# Patient Record
Sex: Male | Born: 1950 | Race: White | Hispanic: No | Marital: Married | Smoking: Former smoker
Health system: Southern US, Community
[De-identification: ages and names within clinical notes are randomized; demographics above are authoritative.]

## PROBLEM LIST (undated history)

## (undated) DIAGNOSIS — C61 Malignant neoplasm of prostate: Secondary | ICD-10-CM

## (undated) DIAGNOSIS — N529 Male erectile dysfunction, unspecified: Secondary | ICD-10-CM

## (undated) DIAGNOSIS — E7800 Pure hypercholesterolemia, unspecified: Secondary | ICD-10-CM

## (undated) DIAGNOSIS — I10 Essential (primary) hypertension: Secondary | ICD-10-CM

## (undated) DIAGNOSIS — R351 Nocturia: Secondary | ICD-10-CM

## (undated) HISTORY — PX: PROSTATE BIOPSY: SHX241

---

## 1971-02-13 HISTORY — PX: INGUINAL HERNIA REPAIR: SUR1180

## 2003-11-11 ENCOUNTER — Ambulatory Visit (HOSPITAL_COMMUNITY): Admission: RE | Admit: 2003-11-11 | Discharge: 2003-11-11 | Payer: Self-pay | Admitting: General Surgery

## 2006-02-12 HISTORY — PX: UMBILICAL HERNIA REPAIR: SHX196

## 2014-05-05 ENCOUNTER — Encounter (HOSPITAL_COMMUNITY): Payer: Self-pay

## 2014-05-05 ENCOUNTER — Emergency Department (HOSPITAL_COMMUNITY)
Admission: EM | Admit: 2014-05-05 | Discharge: 2014-05-06 | Discharge status: Against Medical Advice | Payer: Self-pay | Attending: Emergency Medicine | Admitting: Emergency Medicine

## 2014-05-05 DIAGNOSIS — R040 Epistaxis: Secondary | ICD-10-CM | POA: Insufficient documentation

## 2014-05-05 NOTE — ED Notes (Signed)
Reports nosebleed started after he blew his nose three hours PTA.  Paramedics called to his residence report BP was 257 systolic.

## 2017-02-22 DIAGNOSIS — Z125 Encounter for screening for malignant neoplasm of prostate: Secondary | ICD-10-CM | POA: Diagnosis not present

## 2017-02-22 DIAGNOSIS — E7800 Pure hypercholesterolemia, unspecified: Secondary | ICD-10-CM | POA: Diagnosis not present

## 2017-02-22 DIAGNOSIS — Z6828 Body mass index (BMI) 28.0-28.9, adult: Secondary | ICD-10-CM | POA: Diagnosis not present

## 2017-02-22 DIAGNOSIS — Z0000 Encounter for general adult medical examination without abnormal findings: Secondary | ICD-10-CM | POA: Diagnosis not present

## 2017-02-22 DIAGNOSIS — I10 Essential (primary) hypertension: Secondary | ICD-10-CM | POA: Diagnosis not present

## 2017-02-22 DIAGNOSIS — Z23 Encounter for immunization: Secondary | ICD-10-CM | POA: Diagnosis not present

## 2017-02-22 DIAGNOSIS — R972 Elevated prostate specific antigen [PSA]: Secondary | ICD-10-CM | POA: Diagnosis not present

## 2017-02-22 DIAGNOSIS — Z1389 Encounter for screening for other disorder: Secondary | ICD-10-CM | POA: Diagnosis not present

## 2017-03-06 DIAGNOSIS — R972 Elevated prostate specific antigen [PSA]: Secondary | ICD-10-CM | POA: Diagnosis not present

## 2017-03-13 DIAGNOSIS — I10 Essential (primary) hypertension: Secondary | ICD-10-CM | POA: Diagnosis not present

## 2017-03-13 DIAGNOSIS — F1010 Alcohol abuse, uncomplicated: Secondary | ICD-10-CM | POA: Diagnosis not present

## 2017-03-13 DIAGNOSIS — E7800 Pure hypercholesterolemia, unspecified: Secondary | ICD-10-CM | POA: Diagnosis not present

## 2017-03-13 DIAGNOSIS — Z6828 Body mass index (BMI) 28.0-28.9, adult: Secondary | ICD-10-CM | POA: Diagnosis not present

## 2017-03-13 DIAGNOSIS — R972 Elevated prostate specific antigen [PSA]: Secondary | ICD-10-CM | POA: Diagnosis not present

## 2017-04-08 DIAGNOSIS — C61 Malignant neoplasm of prostate: Secondary | ICD-10-CM | POA: Diagnosis not present

## 2017-04-08 DIAGNOSIS — R972 Elevated prostate specific antigen [PSA]: Secondary | ICD-10-CM | POA: Diagnosis not present

## 2017-04-15 DIAGNOSIS — C61 Malignant neoplasm of prostate: Secondary | ICD-10-CM | POA: Diagnosis not present

## 2017-04-16 ENCOUNTER — Encounter: Payer: Self-pay | Admitting: Radiation Oncology

## 2017-04-30 DIAGNOSIS — C61 Malignant neoplasm of prostate: Secondary | ICD-10-CM | POA: Diagnosis not present

## 2017-05-21 ENCOUNTER — Encounter: Payer: Self-pay | Admitting: Radiation Oncology

## 2017-05-21 NOTE — Progress Notes (Signed)
GU Location of Tumor / Histology: prostatic adenocarcinoma  If Prostate Cancer, Gleason Score is (3 + 4) and PSA is (17.1). Prostate volume: 33 cc.  Dolores Hoose had an elevated PSA of 15 noted early January 2019 by his PCP, Dr. Domenick Gong. Patient reports this was the first time he was ever told his PSA was elevated. Reports this was his first physical because he secured Medicare this year.  Patient referred by Dr. Osborne Casco to Dr. Tresa Moore for further evaluation of an elevated PSA.  Biopsies of prostate (if applicable) revealed:    Past/Anticipated interventions by urology, if any: prostate biopsy, referral for consideration of seed implant  Past/Anticipated interventions by medical oncology, if any: no  Weight changes, if any: no  Bowel/Bladder complaints, if any: Reports nocturia x 3, reports urine stream alternates between strong and weak only at night. IPSS 6. Denies dysuria, hematuria, leakage or incontinence.   Nausea/Vomiting, if any: no  Pain issues, if any:  Reports intermittent muscle soreness in left arm and left leg that resolve without intervention  SAFETY ISSUES:  Prior radiation? no  Pacemaker/ICD? no  Possible current pregnancy? no  Is the patient on methotrexate? no  Current Complaints / other details:  67 year old male. Married. Resides in [place]

## 2017-05-22 ENCOUNTER — Other Ambulatory Visit: Payer: Self-pay

## 2017-05-22 ENCOUNTER — Ambulatory Visit
Admission: RE | Admit: 2017-05-22 | Discharge: 2017-05-22 | Disposition: A | Discharge status: Home / Self Care | Payer: PPO | Source: Ambulatory Visit | Attending: Radiation Oncology | Admitting: Radiation Oncology

## 2017-05-22 ENCOUNTER — Encounter: Payer: Self-pay | Admitting: Radiation Oncology

## 2017-05-22 DIAGNOSIS — C61 Malignant neoplasm of prostate: Secondary | ICD-10-CM

## 2017-05-22 DIAGNOSIS — Z87891 Personal history of nicotine dependence: Secondary | ICD-10-CM | POA: Insufficient documentation

## 2017-05-22 DIAGNOSIS — Z79899 Other long term (current) drug therapy: Secondary | ICD-10-CM | POA: Insufficient documentation

## 2017-05-22 DIAGNOSIS — R972 Elevated prostate specific antigen [PSA]: Secondary | ICD-10-CM | POA: Diagnosis not present

## 2017-05-22 DIAGNOSIS — I10 Essential (primary) hypertension: Secondary | ICD-10-CM | POA: Diagnosis not present

## 2017-05-22 DIAGNOSIS — E7800 Pure hypercholesterolemia, unspecified: Secondary | ICD-10-CM | POA: Insufficient documentation

## 2017-05-22 HISTORY — DX: Essential (primary) hypertension: I10

## 2017-05-22 HISTORY — DX: Pure hypercholesterolemia, unspecified: E78.00

## 2017-05-22 HISTORY — DX: Malignant neoplasm of prostate: C61

## 2017-05-22 NOTE — Progress Notes (Signed)
See progress note under physician encounter. 

## 2017-05-22 NOTE — Progress Notes (Signed)
Radiation Oncology         [phone] ________________________________  Initial Outpatient Consultation  Name: Willie Jackson MRN: [id number]  Date: 05/22/2017  DOB: 1950-11-22  CC:No primary care provider on file.  Alexis Frock, MD   REFERRING PHYSICIAN: Alexis Frock, MD  DIAGNOSIS: 67 y.o. gentleman with Stage T1c adenocarcinoma of the prostate with Gleason Score of 3+4, and PSA of 17.1    ICD-10-CM   1. Malignant neoplasm of prostate ([place]) C61     HISTORY OF PRESENT ILLNESS: Willie Jackson is a 67 y.o. male with a diagnosis of prostate cancer. He was noted to have an elevated PSA of 15 by his primary care physician, Dr. Osborne Casco.  He reports this was his first physical and the first time he was ever told his PSA was elevated.  This was repeated a few weeks later on 02/25/17 and was further elevated at 17.1.  Accordingly, he was referred for evaluation in urology to Dr. Tresa Moore on 03/06/17, where a digital rectal examination was performed at that time revealing induration on the right but no discrete nodularity.  The patient proceeded to transrectal ultrasound with 12 biopsies of the prostate on 04/08/17.  The prostate volume measured 33 cc.  Out of 12 core biopsies, 3 were positive.  The maximum Gleason score was 3+4, and this was seen in the right mid lateral and right apex lateral.  Additionally there was Gleason 3+3 disease in the left mid lateral.    The patient reviewed the biopsy results with his urologist and he has kindly been referred today for discussion of potential radiation treatment options.   PREVIOUS RADIATION THERAPY: No  PAST MEDICAL HISTORY:  Past Medical History:  Diagnosis Date  . Hypercholesterolemia   . Hypertension   . Prostate cancer ([place])       PAST SURGICAL HISTORY: Past Surgical History:  Procedure Laterality Date  . HERNIA REPAIR    . HERNIA REPAIR     at the age of 51-20  . INGUINAL HERNIA REPAIR    . prostate needle biopsy      FAMILY  HISTORY:  Family History  Problem Relation Age of Onset  . Lung cancer Mother   . Throat cancer Father     SOCIAL HISTORY:  Social History   Socioeconomic History  . Marital status: Legally Separated    Spouse name: Not on file  . Number of children: Not on file  . Years of education: Not on file  . Highest education level: Not on file  Occupational History  . Occupation: Dealer  Social Needs  . Financial resource strain: Not on file  . Food insecurity:    Worry: Not on file    Inability: Not on file  . Transportation needs:    Medical: Not on file    Non-medical: Not on file  Tobacco Use  . Smoking status: Former Smoker    Packs/day: 1.00    Years: 41.00    Pack years: 41.00    Types: Cigarettes    Last attempt to quit: 02/12/1994    Years since quitting: 23.2  . Smokeless tobacco: Never Used  Substance and Sexual Activity  . Alcohol use: Yes    Alcohol/week: 7.2 oz    Types: 12 Cans of beer per week  . Drug use: No  . Sexual activity: Yes  Lifestyle  . Physical activity:    Days per week: Not on file    Minutes per session: Not on file  .  Stress: Not on file  Relationships  . Social connections:    Talks on phone: Not on file    Gets together: Not on file    Attends religious service: Not on file    Active member of club or organization: Not on file    Attends meetings of clubs or organizations: Not on file    Relationship status: Not on file  . Intimate partner violence:    Fear of current or ex partner: Not on file    Emotionally abused: Not on file    Physically abused: Not on file    Forced sexual activity: Not on file  Other Topics Concern  . Not on file  Social History Narrative   Patient and wife reside in [place]. Three children total. Wife has 1 by first marriage. Patient has two by first marriage. 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  MEDICATIONS:  Current Outpatient Medications  Medication Sig Dispense Refill  . atorvastatin (LIPITOR) 20 MG tablet Take 20 mg by mouth daily.    Marland Kitchen olmesartan (BENICAR) 20 MG tablet Take 20 mg by mouth daily.     No current facility-administered medications for this encounter.     REVIEW OF SYSTEMS:  On review of systems, the patient reports that he is doing well overall. He denies any chest pain, shortness of breath, cough, fevers, chills, night sweats, or unintended weight changes. He denies any bowel disturbances, and denies abdominal pain, nausea or vomiting. He reports intermittent muscle soreness in his left arm and left leg that resolves without any intervention. His IPSS was 6, indicating mild urinary symptoms with nocturia x3. He reports his urine stream alternates between strong and weak at night. He denies any dysuria, hematuria, leakage or incontinence. He is able to complete sexual activity with most attempts. A complete review of systems is obtained and is otherwise negative.  PHYSICAL EXAM:  Wt Readings from Last 3 Encounters:  05/22/17 178 lb 3.2 oz (80.8 kg)  05/05/14 175 lb (79.4 kg)   Temp Readings from Last 3 Encounters:  05/22/17 97.9 F (36.6 C) (Oral)  05/05/14 98.2 F (36.8 C) (Oral)   BP Readings from Last 3 Encounters:  05/22/17 (!) 147/73  05/05/14 186/97   Pulse Readings from Last 3 Encounters:  05/22/17 84  05/05/14 109   Pain Assessment Pain Score: 0-No pain/10  In general this is a well appearing caucasian male in no acute distress. He is alert and oriented x4 and appropriate throughout the examination. HEENT reveals that the patient is normocephalic, atraumatic. EOMs are intact. PERRLA. Skin is intact without any evidence of gross lesions. Cardiovascular exam reveals a regular rate and rhythm, no clicks rubs or murmurs are auscultated. Chest is clear to auscultation bilaterally. Lymphatic assessment is performed and does not reveal any  adenopathy in the cervical, supraclavicular, axillary, or inguinal chains. Abdomen has active bowel sounds in all quadrants and is intact. The abdomen is soft, non tender, non distended. Lower extremities are negative for pretibial pitting edema, deep calf tenderness, cyanosis or clubbing.   KPS = 100  100 - Normal; no complaints; no evidence of disease. 90   - Able to carry on normal activity; minor signs or symptoms of disease. 80   - Normal activity with effort; some signs or symptoms of disease. 86   - Cares for self; unable to carry on  normal activity or to do active work. 60   - Requires occasional assistance, but is able to care for most of his personal needs. 50   - Requires considerable assistance and frequent medical care. 78   - Disabled; requires special care and assistance. 90   - Severely disabled; hospital admission is indicated although death not imminent. 73   - Very sick; hospital admission necessary; active supportive treatment necessary. 10   - Moribund; fatal processes progressing rapidly. 0     - Dead  Karnofsky DA, Abelmann WH, Craver LS and Burchenal JH [phone]) The use of the nitrogen mustards in the palliative treatment of carcinoma: with particular reference to bronchogenic carcinoma Cancer 1 [phone]  LABORATORY DATA:  No results found for: WBC, HGB, HCT, MCV, PLT No results found for: NA, K, CL, CO2 No results found for: ALT, AST, GGT, ALKPHOS, BILITOT   RADIOGRAPHY: No results found.    IMPRESSION/PLAN: 1. 67 y.o. gentleman with Stage T1c adenocarcinoma of the prostate with Gleason Score of 3+4, and PSA of 17.1. we discussed the patient's workup and outlined the nature of prostate cancer in this setting. The patient's T stage, Gleason's score, and PSA put him into the favorable intermediate risk group. Accordingly, he is eligible for a variety of potential treatment options including prostatectomy, brachytherapy, 5.5 - 8 weeks of external radiation or 5 weeks of  external radiation combined with a brachytherapy boost. We discussed the available radiation techniques, and focused on the details and logistics and delivery. We discussed and outlined the risks, benefits, short and long-term effects associated with radiotherapy and compared and contrasted these with prostatectomy. We discussed the role of SpaceOAR in reducing the rectal toxicity associated with radiotherapy.   At the conclusion of our conversation, the patient elects to proceed with brachytherapy and use of SpaceOAR to reduce rectal toxicity from radiotherapy.  We will share our discussion with Dr. Tresa Moore and move forward with scheduling this procedure.  The patient met briefly with Romie Jumper in our office who will be working closely with him to coordinate OR scheduling and pre and post procedure appointments.  We will contact the pharmaceutical rep to ensure that [place] is available at the time of procedure.  He will have a prostate MRI following his post-seed CT SIM to confirm appropriate distribution of the [place].  We spent more than 50% of today's time face to face with the patient in counseling and/or coordination of care.   Nicholos Johns, PA-C    Tyler Pita, MD  [facility] Direct Dial: [phone]  Fax: [phone] [email or web address]  Skype  LinkedIn    Page Me   This document serves as a record of services personally performed by Tyler Pita, MD and Freeman Caldron, PA-C. It was created on their behalf by Rae Lips, a trained medical scribe. The creation of this record is based on the scribe's personal observations and the providers' statements to them. This document has been checked and approved by the attending providers.

## 2017-05-23 ENCOUNTER — Encounter: Payer: Self-pay | Admitting: Medical Oncology

## 2017-05-29 ENCOUNTER — Telehealth: Payer: Self-pay | Admitting: *Deleted

## 2017-05-29 NOTE — Telephone Encounter (Signed)
XXXXX

## 2017-05-29 NOTE — Telephone Encounter (Signed)
CALLED PATIENT TO INFORM OF PRE-SEED APPT., SPOKE WITH PATIENT AND HE IS AWARE OF THESE APPTS. ON 06-28-17

## 2017-06-12 DIAGNOSIS — I10 Essential (primary) hypertension: Secondary | ICD-10-CM | POA: Diagnosis not present

## 2017-06-12 DIAGNOSIS — R972 Elevated prostate specific antigen [PSA]: Secondary | ICD-10-CM | POA: Diagnosis not present

## 2017-06-12 DIAGNOSIS — Z6828 Body mass index (BMI) 28.0-28.9, adult: Secondary | ICD-10-CM | POA: Diagnosis not present

## 2017-06-12 DIAGNOSIS — C61 Malignant neoplasm of prostate: Secondary | ICD-10-CM | POA: Diagnosis not present

## 2017-06-12 DIAGNOSIS — E7800 Pure hypercholesterolemia, unspecified: Secondary | ICD-10-CM | POA: Diagnosis not present

## 2017-06-12 DIAGNOSIS — F1010 Alcohol abuse, uncomplicated: Secondary | ICD-10-CM | POA: Diagnosis not present

## 2017-06-17 ENCOUNTER — Other Ambulatory Visit: Payer: Self-pay | Admitting: Urology

## 2017-06-18 ENCOUNTER — Telehealth: Payer: Self-pay | Admitting: *Deleted

## 2017-06-18 NOTE — Telephone Encounter (Signed)
Called patient to inform of implant date, lvm for a return call 

## 2017-06-25 ENCOUNTER — Telehealth: Payer: Self-pay | Admitting: *Deleted

## 2017-06-25 NOTE — Telephone Encounter (Signed)
Returned patient's phone call, lvm for a return call 

## 2017-06-27 ENCOUNTER — Telehealth: Payer: Self-pay | Admitting: *Deleted

## 2017-06-27 NOTE — Telephone Encounter (Signed)
CALLED PATIENT TO REMIND OF PRE-SEED APPTS. FOR 06-28-17, LVM FOR A RETURN CALL 

## 2017-06-28 ENCOUNTER — Encounter (HOSPITAL_COMMUNITY)
Admission: RE | Admit: 2017-06-28 | Discharge: 2017-06-28 | Disposition: A | Discharge status: Home / Self Care | Payer: PPO | Source: Ambulatory Visit

## 2017-06-28 ENCOUNTER — Ambulatory Visit (HOSPITAL_COMMUNITY)
Admission: RE | Admit: 2017-06-28 | Discharge: 2017-06-28 | Disposition: A | Discharge status: Home / Self Care | Payer: PPO | Source: Ambulatory Visit | Attending: Urology | Admitting: Urology

## 2017-06-28 ENCOUNTER — Ambulatory Visit: Admission: RE | Admit: 2017-06-28 | Payer: PPO | Source: Ambulatory Visit | Admitting: Radiation Oncology

## 2017-06-28 ENCOUNTER — Ambulatory Visit
Admission: RE | Admit: 2017-06-28 | Discharge: 2017-06-28 | Disposition: A | Discharge status: Home / Self Care | Payer: PPO | Source: Ambulatory Visit | Attending: Radiation Oncology | Admitting: Radiation Oncology

## 2017-06-28 DIAGNOSIS — Z01818 Encounter for other preprocedural examination: Secondary | ICD-10-CM | POA: Diagnosis not present

## 2017-06-28 DIAGNOSIS — Z510 Encounter for antineoplastic radiation therapy: Secondary | ICD-10-CM | POA: Diagnosis not present

## 2017-06-28 DIAGNOSIS — C61 Malignant neoplasm of prostate: Secondary | ICD-10-CM | POA: Insufficient documentation

## 2017-06-28 NOTE — Progress Notes (Signed)
  Radiation Oncology         [phone]) [phone] ________________________________  Name: Willie Jackson MRN: [id number]  Date: 06/28/2017  DOB: [birth year]  SIMULATION AND TREATMENT PLANNING NOTE PUBIC ARCH STUDY  TW:SFKCLE, Pcp Not In  Alexis Frock, MD  DIAGNOSIS: 67 y.o. male with Stage T1c adenocarcinoma of the prostate with Gleason Score of 3+4, and PSA of 17.1    ICD-10-CM   1. Malignant neoplasm of prostate ([place]) C61     COMPLEX SIMULATION:  The patient presented today for evaluation for possible prostate seed implant. He was brought to the radiation planning suite and placed supine on the CT couch. A 3-dimensional image study set was obtained in upload to the planning computer. There, on each axial slice, I contoured the prostate gland. Then, using three-dimensional radiation planning tools I reconstructed the prostate in view of the structures from the transperineal needle pathway to assess for possible pubic arch interference. In doing so, I did not appreciate any pubic arch interference. Also, the patient's prostate volume was estimated based on the drawn structure. The volume was 29 cc.  Given the pubic arch appearance and prostate volume, patient remains a good candidate to proceed with prostate seed implant. Today, he freely provided informed written consent to proceed.    PLAN: The patient will undergo prostate seed implant.   ________________________________  Sheral Apley. Tammi Klippel, M.D.  This document serves as a record of services personally performed by Tyler Pita, MD. It was created on his behalf by Rae Lips, a trained medical scribe. The creation of this record is based on the scribe's personal observations and the provider's statements to them. This document has been checked and approved by the attending provider.

## 2017-07-15 DIAGNOSIS — C61 Malignant neoplasm of prostate: Secondary | ICD-10-CM | POA: Diagnosis not present

## 2017-07-15 DIAGNOSIS — N5201 Erectile dysfunction due to arterial insufficiency: Secondary | ICD-10-CM | POA: Diagnosis not present

## 2017-08-01 ENCOUNTER — Other Ambulatory Visit: Payer: Self-pay | Admitting: Urology

## 2017-08-01 DIAGNOSIS — C61 Malignant neoplasm of prostate: Secondary | ICD-10-CM

## 2017-08-06 ENCOUNTER — Telehealth: Payer: Self-pay | Admitting: *Deleted

## 2017-08-06 NOTE — Telephone Encounter (Signed)
Called patient to remind of labs for 08-07-17, spoke with patient and he is aware of this appt.

## 2017-08-07 ENCOUNTER — Encounter (HOSPITAL_COMMUNITY)
Admission: RE | Admit: 2017-08-07 | Discharge: 2017-08-07 | Disposition: A | Discharge status: Home / Self Care | Payer: PPO | Source: Ambulatory Visit | Attending: Urology | Admitting: Urology

## 2017-08-07 DIAGNOSIS — Z01812 Encounter for preprocedural laboratory examination: Secondary | ICD-10-CM | POA: Diagnosis not present

## 2017-08-07 LAB — COMPREHENSIVE METABOLIC PANEL
ALBUMIN: 4.3 g/dL (ref 3.5–5.0)
ALK PHOS: 54 U/L (ref 38–126)
ALT: 28 U/L (ref 0–44)
ANION GAP: 7 (ref 5–15)
AST: 23 U/L (ref 15–41)
BUN: 20 mg/dL (ref 8–23)
CALCIUM: 9.4 mg/dL (ref 8.9–10.3)
CHLORIDE: 105 mmol/L (ref 98–111)
CO2: 27 mmol/L (ref 22–32)
Creatinine, Ser: 1.13 mg/dL (ref 0.61–1.24)
GFR calc Af Amer: >60 mL/min (ref >60)
GFR calc non Af Amer: >60 mL/min (ref >60)
GLUCOSE: 121 mg/dL — AB (ref 70–99)
Potassium: 4.4 mmol/L (ref 3.5–5.1)
SODIUM: 139 mmol/L (ref 135–145)
Total Bilirubin: 1 mg/dL (ref 0.3–1.2)
Total Protein: 7.7 g/dL (ref 6.5–8.1)

## 2017-08-07 LAB — CBC
HCT: 41.1 % (ref 39.0–52.0)
HEMOGLOBIN: 14.3 g/dL (ref 13.0–17.0)
MCH: 31.9 pg (ref 26.0–34.0)
MCHC: 34.8 g/dL (ref 30.0–36.0)
MCV: 91.7 fL (ref 78.0–100.0)
PLATELETS: 220 10*3/uL (ref 150–400)
RBC: 4.48 MIL/uL (ref 4.22–5.81)
RDW: 12.4 % (ref 11.5–15.5)
WBC: 7.7 10*3/uL (ref 4.0–10.5)

## 2017-08-07 LAB — APTT: APTT: 29 s (ref 24–36)

## 2017-08-07 LAB — PROTIME-INR
INR: 1.14
Prothrombin Time: 14.5 seconds (ref 11.4–15.2)

## 2017-08-08 ENCOUNTER — Other Ambulatory Visit: Payer: Self-pay

## 2017-08-08 ENCOUNTER — Encounter (HOSPITAL_BASED_OUTPATIENT_CLINIC_OR_DEPARTMENT_OTHER): Payer: Self-pay | Admitting: *Deleted

## 2017-08-08 NOTE — Progress Notes (Signed)
Spoke w/ pt via phone for pre-op interview.  Npo after mn w/ exception clear liquids until 0830 (no cream/milk products).  Arrive at 1230.  Current lab results (dated 08-07-2017), cxr, and ekg in chart and epic.  Will take lipitor am dos w/ sips of water and do fleet enema.

## 2017-08-13 ENCOUNTER — Telehealth: Payer: Self-pay | Admitting: *Deleted

## 2017-08-13 MED ORDER — GENTAMICIN SULFATE 40 MG/ML IJ SOLN
400.0000 mg | Freq: Once | INTRAVENOUS | Status: AC
  Administered 2017-08-14: 400 mg via INTRAVENOUS
  Filled 2017-08-13 (×2): qty 10

## 2017-08-13 NOTE — Telephone Encounter (Signed)
CALLED PATIENT TO REMIND OF PROCEDURE FOR 08-14-17, LVM FOR A RETURN CALL

## 2017-08-14 ENCOUNTER — Ambulatory Visit (HOSPITAL_BASED_OUTPATIENT_CLINIC_OR_DEPARTMENT_OTHER)
Admission: RE | Admit: 2017-08-14 | Discharge: 2017-08-14 | Disposition: A | Discharge status: Home / Self Care | Payer: PPO | Source: Ambulatory Visit | Attending: Urology | Admitting: Urology

## 2017-08-14 ENCOUNTER — Ambulatory Visit (HOSPITAL_BASED_OUTPATIENT_CLINIC_OR_DEPARTMENT_OTHER): Payer: PPO | Admitting: Anesthesiology

## 2017-08-14 ENCOUNTER — Encounter (HOSPITAL_BASED_OUTPATIENT_CLINIC_OR_DEPARTMENT_OTHER): Payer: Self-pay | Admitting: Anesthesiology

## 2017-08-14 ENCOUNTER — Ambulatory Visit (HOSPITAL_COMMUNITY): Payer: PPO

## 2017-08-14 ENCOUNTER — Encounter (HOSPITAL_BASED_OUTPATIENT_CLINIC_OR_DEPARTMENT_OTHER)
Admission: RE | Disposition: A | Discharge status: Home / Self Care | Payer: Self-pay | Source: Ambulatory Visit | Attending: Urology

## 2017-08-14 DIAGNOSIS — Z87891 Personal history of nicotine dependence: Secondary | ICD-10-CM | POA: Diagnosis not present

## 2017-08-14 DIAGNOSIS — I10 Essential (primary) hypertension: Secondary | ICD-10-CM | POA: Diagnosis not present

## 2017-08-14 DIAGNOSIS — Z6828 Body mass index (BMI) 28.0-28.9, adult: Secondary | ICD-10-CM | POA: Insufficient documentation

## 2017-08-14 DIAGNOSIS — E669 Obesity, unspecified: Secondary | ICD-10-CM | POA: Insufficient documentation

## 2017-08-14 DIAGNOSIS — E785 Hyperlipidemia, unspecified: Secondary | ICD-10-CM | POA: Diagnosis not present

## 2017-08-14 DIAGNOSIS — Z79899 Other long term (current) drug therapy: Secondary | ICD-10-CM | POA: Diagnosis not present

## 2017-08-14 DIAGNOSIS — E7800 Pure hypercholesterolemia, unspecified: Secondary | ICD-10-CM | POA: Diagnosis not present

## 2017-08-14 DIAGNOSIS — C61 Malignant neoplasm of prostate: Secondary | ICD-10-CM | POA: Diagnosis not present

## 2017-08-14 DIAGNOSIS — Z01818 Encounter for other preprocedural examination: Secondary | ICD-10-CM

## 2017-08-14 HISTORY — DX: Nocturia: R35.1

## 2017-08-14 HISTORY — PX: SPACE OAR INSTILLATION: SHX6769

## 2017-08-14 HISTORY — DX: Male erectile dysfunction, unspecified: N52.9

## 2017-08-14 HISTORY — PX: RADIOACTIVE SEED IMPLANT: SHX5150

## 2017-08-14 SURGERY — INSERTION, RADIATION SOURCE, PROSTATE
Anesthesia: General

## 2017-08-14 MED ORDER — FENTANYL CITRATE (PF) 100 MCG/2ML IJ SOLN
INTRAMUSCULAR | Status: DC | PRN
  Administered 2017-08-14: 50 ug via INTRAVENOUS
  Administered 2017-08-14 (×4): 25 ug via INTRAVENOUS
  Administered 2017-08-14: 50 ug via INTRAVENOUS

## 2017-08-14 MED ORDER — FENTANYL CITRATE (PF) 100 MCG/2ML IJ SOLN
INTRAMUSCULAR | Status: AC
  Filled 2017-08-14: qty 2

## 2017-08-14 MED ORDER — IOHEXOL 300 MG/ML  SOLN
INTRAMUSCULAR | Status: DC | PRN
  Administered 2017-08-14: 7 mL

## 2017-08-14 MED ORDER — KETOROLAC TROMETHAMINE 30 MG/ML IJ SOLN
30.0000 mg | Freq: Once | INTRAMUSCULAR | Status: DC | PRN
  Filled 2017-08-14: qty 1

## 2017-08-14 MED ORDER — LIDOCAINE 2% (20 MG/ML) 5 ML SYRINGE
INTRAMUSCULAR | Status: AC
  Filled 2017-08-14: qty 5

## 2017-08-14 MED ORDER — LIDOCAINE HCL (CARDIAC) PF 100 MG/5ML IV SOSY
PREFILLED_SYRINGE | INTRAVENOUS | Status: DC | PRN
  Administered 2017-08-14: 100 mg via INTRAVENOUS

## 2017-08-14 MED ORDER — PROPOFOL 10 MG/ML IV BOLUS
INTRAVENOUS | Status: DC | PRN
  Administered 2017-08-14: 200 mg via INTRAVENOUS

## 2017-08-14 MED ORDER — HYDROMORPHONE HCL 1 MG/ML IJ SOLN
0.2500 mg | INTRAMUSCULAR | Status: DC | PRN
  Filled 2017-08-14: qty 0.5

## 2017-08-14 MED ORDER — FLEET ENEMA 7-19 GM/118ML RE ENEM
1.0000 | ENEMA | Freq: Once | RECTAL | Status: DC
  Filled 2017-08-14: qty 1

## 2017-08-14 MED ORDER — KETOROLAC TROMETHAMINE 30 MG/ML IJ SOLN
INTRAMUSCULAR | Status: DC | PRN
  Administered 2017-08-14: 30 mg via INTRAVENOUS

## 2017-08-14 MED ORDER — ONDANSETRON HCL 4 MG/2ML IJ SOLN
INTRAMUSCULAR | Status: AC
  Filled 2017-08-14: qty 2

## 2017-08-14 MED ORDER — FENTANYL CITRATE (PF) 100 MCG/2ML IJ SOLN
INTRAMUSCULAR | Status: AC
Start: 2017-08-14 — End: ?
  Filled 2017-08-14: qty 2

## 2017-08-14 MED ORDER — KETOROLAC TROMETHAMINE 30 MG/ML IJ SOLN
INTRAMUSCULAR | Status: AC
  Filled 2017-08-14: qty 1

## 2017-08-14 MED ORDER — LACTATED RINGERS IV SOLN
INTRAVENOUS | Status: DC
  Administered 2017-08-14: 16:00:00 via INTRAVENOUS
  Administered 2017-08-14: 1000 mL via INTRAVENOUS
  Filled 2017-08-14: qty 1000

## 2017-08-14 MED ORDER — MIDAZOLAM HCL 2 MG/2ML IJ SOLN
INTRAMUSCULAR | Status: AC
  Filled 2017-08-14: qty 2

## 2017-08-14 MED ORDER — STERILE WATER FOR IRRIGATION IR SOLN
Status: DC | PRN
  Administered 2017-08-14: 500 mL

## 2017-08-14 MED ORDER — MIDAZOLAM HCL 5 MG/5ML IJ SOLN
INTRAMUSCULAR | Status: DC | PRN
  Administered 2017-08-14: 2 mg via INTRAVENOUS

## 2017-08-14 MED ORDER — TRAMADOL HCL 50 MG PO TABS: 50.0000 mg | ORAL_TABLET | Freq: Four times a day (QID) | ORAL | 0 refills | Status: AC | PRN

## 2017-08-14 MED ORDER — ONDANSETRON HCL 4 MG/2ML IJ SOLN
INTRAMUSCULAR | Status: DC | PRN
  Administered 2017-08-14: 4 mg via INTRAVENOUS

## 2017-08-14 MED ORDER — SENNOSIDES-DOCUSATE SODIUM 8.6-50 MG PO TABS: 1.0000 | ORAL_TABLET | Freq: Two times a day (BID) | ORAL | 0 refills | Status: DC

## 2017-08-14 MED ORDER — SODIUM CHLORIDE 0.9 % IJ SOLN
INTRAMUSCULAR | Status: DC | PRN
  Administered 2017-08-14: 10 mL

## 2017-08-14 MED ORDER — PROMETHAZINE HCL 25 MG/ML IJ SOLN
6.2500 mg | INTRAMUSCULAR | Status: DC | PRN
  Filled 2017-08-14: qty 1

## 2017-08-14 MED ORDER — DEXAMETHASONE SODIUM PHOSPHATE 10 MG/ML IJ SOLN
INTRAMUSCULAR | Status: AC
Start: 2017-08-14 — End: ?
  Filled 2017-08-14: qty 1

## 2017-08-14 MED ORDER — DEXAMETHASONE SODIUM PHOSPHATE 4 MG/ML IJ SOLN
INTRAMUSCULAR | Status: DC | PRN
  Administered 2017-08-14: 10 mg via INTRAVENOUS

## 2017-08-14 MED ORDER — TAMSULOSIN HCL 0.4 MG PO CAPS: 0.4000 mg | ORAL_CAPSULE | Freq: Every day | ORAL | 11 refills | Status: DC | PRN

## 2017-08-14 MED ORDER — PROPOFOL 10 MG/ML IV BOLUS
INTRAVENOUS | Status: AC
  Filled 2017-08-14: qty 40

## 2017-08-14 MED ORDER — SODIUM CHLORIDE 0.9 % IV SOLN
INTRAVENOUS | Status: AC | PRN
  Administered 2017-08-14: 1000 mL

## 2017-08-14 SURGICAL SUPPLY — 37 items
BAG URINE DRAINAGE (UROLOGICAL SUPPLIES) ×3 IMPLANT
BLADE CLIPPER SURG (BLADE) ×3 IMPLANT
CATH FOLEY 2WAY SLVR  5CC 16FR (CATHETERS) ×2
CATH FOLEY 2WAY SLVR 5CC 16FR (CATHETERS) ×1 IMPLANT
CATH ROBINSON RED A/P 16FR (CATHETERS) IMPLANT
CATH ROBINSON RED A/P 20FR (CATHETERS) ×3 IMPLANT
CLOTH BEACON ORANGE TIMEOUT ST (SAFETY) ×3 IMPLANT
CONT SPECI 4OZ STER CLIK (MISCELLANEOUS) ×6 IMPLANT
COVER BACK TABLE 60X90IN (DRAPES) ×3 IMPLANT
COVER MAYO STAND STRL (DRAPES) ×3 IMPLANT
DRSG TEGADERM 4X4.75 (GAUZE/BANDAGES/DRESSINGS) ×4 IMPLANT
DRSG TEGADERM 8X12 (GAUZE/BANDAGES/DRESSINGS) ×6 IMPLANT
GAUZE SPONGE 4X4 12PLY STRL (GAUZE/BANDAGES/DRESSINGS) ×2 IMPLANT
GLOVE BIO SURGEON STRL SZ 6 (GLOVE) IMPLANT
GLOVE BIO SURGEON STRL SZ7 (GLOVE) IMPLANT
GLOVE BIO SURGEON STRL SZ7.5 (GLOVE) ×3 IMPLANT
GLOVE BIO SURGEON STRL SZ8 (GLOVE) IMPLANT
GLOVE BIOGEL PI IND STRL 6 (GLOVE) IMPLANT
GLOVE BIOGEL PI IND STRL 8 (GLOVE) IMPLANT
GLOVE BIOGEL PI INDICATOR 6 (GLOVE)
GLOVE BIOGEL PI INDICATOR 8 (GLOVE)
GLOVE ECLIPSE 8.0 STRL XLNG CF (GLOVE) ×3 IMPLANT
GLOVE INDICATOR 7.0 STRL GRN (GLOVE) IMPLANT
GOWN STRL REUS W/TWL LRG LVL3 (GOWN DISPOSABLE) ×3 IMPLANT
GOWN STRL REUS W/TWL XL LVL3 (GOWN DISPOSABLE) ×3 IMPLANT
HOLDER FOLEY CATH W/STRAP (MISCELLANEOUS) IMPLANT
I-SEED AGX100 ×2 IMPLANT
IMPL SPACEOAR SYSTEM 10ML (MISCELLANEOUS) ×1 IMPLANT
IMPLANT SPACEOAR SYSTEM 10ML (MISCELLANEOUS) ×3
IV SOD CHL 0.9% 1000ML (IV SOLUTION) ×3 IMPLANT
KIT TURNOVER CYSTO (KITS) ×3 IMPLANT
MARKER SKIN DUAL TIP RULER LAB (MISCELLANEOUS) ×3 IMPLANT
PACK CYSTO (CUSTOM PROCEDURE TRAY) ×3 IMPLANT
SURGILUBE 2OZ TUBE FLIPTOP (MISCELLANEOUS) ×3 IMPLANT
SYR 10ML LL (SYRINGE) ×3 IMPLANT
UNDERPAD 30X30 (UNDERPADS AND DIAPERS) ×6 IMPLANT
WATER STERILE IRR 500ML POUR (IV SOLUTION) ×3 IMPLANT

## 2017-08-14 NOTE — Discharge Instructions (Signed)
1 - You may have urinary urgency (bladder spasms) and bloody urine on / off with x few weeks. This is normal.  2 - Call MD or go to ER for fever >102, severe pain / nausea / vomiting not relieved by medications, or acute change in medical status  Radioactive Seed Implant Home Care Instructions   Activity:    Rest for the remainder of the day.  Do not drive or operate equipment today.  You may resume normal activities in a few days as instructed by your physician, without risk of harmful radiation exposure to those around you, provided you follow the time and distance precautions on the Radiation Oncology Instruction Sheet.   Meals: Drink plenty of lipuids and eat light foods, such as gelatin or soup this evening .  You may return to normal meal plan tomorrow.  Return To Work: You may return to work as instructed by Naval architect.  Special Instruction:   If any seeds are found, use tweezers to pick up seeds and place in a glass container of any kind and bring to your physician's office.  Call your physician if any of these symptoms occur:   Persistent or heavy bleeding  Urine stream diminishes or stops completely after catheter is removed  Fever equal to or greater than 101 degrees F  Cloudy urine with a strong foul odor  Severe pain  You may feel some burning pain and/or hesitancy when you urinate after the catheter is removed.  These symptoms may increase over the next few weeks, but should diminish within forur to six weeks.  Applying moist heat to the lower abdomen or a hot tub bath may help relieve the pain.  If the discomfort becomes severe, please call your physician for additional medications.  Post Anesthesia Home Care Instructions  Activity: Get plenty of rest for the remainder of the day. A responsible individual must stay with you for 24 hours following the procedure.  For the next 24 hours, DO NOT: -Drive a car -Paediatric nurse -Drink alcoholic beverages -Take  any medication unless instructed by your physician -Make any legal decisions or sign important papers.  Meals: Start with liquid foods such as gelatin or soup. Progress to regular foods as tolerated. Avoid greasy, spicy, heavy foods. If nausea and/or vomiting occur, drink only clear liquids until the nausea and/or vomiting subsides. Call your physician if vomiting continues.  Special Instructions/Symptoms: Your throat may feel dry or sore from the anesthesia or the breathing tube placed in your throat during surgery. If this causes discomfort, gargle with warm salt water. The discomfort should disappear within 24 hours.  If you had a scopolamine patch placed behind your ear for the management of post- operative nausea and/or vomiting:  1. The medication in the patch is effective for 72 hours, after which it should be removed.  Wrap patch in a tissue and discard in the trash. Wash hands thoroughly with soap and water. 2. You may remove the patch earlier than 72 hours if you experience unpleasant side effects which may include dry mouth, dizziness or visual disturbances. 3. Avoid touching the patch. Wash your hands with soap and water after contact with the patch.

## 2017-08-14 NOTE — Anesthesia Procedure Notes (Signed)
Procedure Name: LMA Insertion Date/Time: 08/14/2017 2:16 PM Performed by: Justice Rocher, CRNA Pre-anesthesia Checklist: Patient identified, Emergency Drugs available, Suction available and Patient being monitored Patient Re-evaluated:Patient Re-evaluated prior to induction Oxygen Delivery Method: Circle system utilized Preoxygenation: Pre-oxygenation with 100% oxygen Induction Type: IV induction Ventilation: Mask ventilation without difficulty LMA: LMA inserted LMA Size: 4.0 Number of attempts: 1 Airway Equipment and Method: Bite block Placement Confirmation: positive ETCO2 and breath sounds checked- equal and bilateral Tube secured with: Tape Dental Injury: Teeth and Oropharynx as per pre-operative assessment

## 2017-08-14 NOTE — Anesthesia Preprocedure Evaluation (Signed)
Anesthesia Evaluation  Patient identified by MRN, date of birth, ID band Patient awake    Reviewed: Allergy & Precautions, NPO status , Patient's Chart, lab work & pertinent test results  Airway Mallampati: II  TM Distance: >3 FB Neck ROM: Full    Dental no notable dental hx.    Pulmonary neg pulmonary ROS, former smoker,    Pulmonary exam normal breath sounds clear to auscultation       Cardiovascular hypertension, Normal cardiovascular exam Rhythm:Regular Rate:Normal     Neuro/Psych negative neurological ROS  negative psych ROS   GI/Hepatic negative GI ROS, Neg liver ROS,   Endo/Other  negative endocrine ROS  Renal/GU negative Renal ROS  negative genitourinary   Musculoskeletal negative musculoskeletal ROS (+)   Abdominal   Peds negative pediatric ROS (+)  Hematology negative hematology ROS (+)   Anesthesia Other Findings   Reproductive/Obstetrics negative OB ROS                             Anesthesia Physical Anesthesia Plan  ASA: II  Anesthesia Plan: General   Post-op Pain Management:    Induction: Intravenous  PONV Risk Score and Plan: 2 and Ondansetron, Dexamethasone and Treatment may vary due to age or medical condition  Airway Management Planned: LMA  Additional Equipment:   Intra-op Plan:   Post-operative Plan: Extubation in OR  Informed Consent: I have reviewed the patients History and Physical, chart, labs and discussed the procedure including the risks, benefits and alternatives for the proposed anesthesia with the patient or authorized representative who has indicated his/her understanding and acceptance.     Dental advisory given  Plan Discussed with: CRNA and Surgeon  Anesthesia Plan Comments:         Anesthesia Quick Evaluation  

## 2017-08-14 NOTE — Transfer of Care (Signed)
Immediate Anesthesia Transfer of Care Note  Patient: Willie Jackson  Procedure(s) Performed: Procedure(s) (LRB): RADIOACTIVE SEED IMPLANT/BRACHYTHERAPY IMPLANT (N/A) SPACE OAR INSTILLATION (N/A)  Patient Location: PACU  Anesthesia Type: General  Level of Consciousness: awake, sedated, patient cooperative and responds to stimulation  Airway & Oxygen Therapy: Patient Spontanous Breathing and Patient connected to NCO2  Post-op Assessment: Report given to PACU RN, Post -op Vital signs reviewed and stable and Patient moving all extremities  Post vital signs: Reviewed and stable  Complications: No apparent anesthesia complications

## 2017-08-14 NOTE — Brief Op Note (Signed)
08/14/2017  3:41 PM  PATIENT:  Willie Jackson  67 y.o. male  PRE-OPERATIVE DIAGNOSIS:  PROSTATE CANCER  POST-OPERATIVE DIAGNOSIS:  PROSTATE CANCER  PROCEDURE:  Procedure(s): RADIOACTIVE SEED IMPLANT/BRACHYTHERAPY IMPLANT (N/A) SPACE OAR INSTILLATION (N/A)  SURGEON:  Surgeon(s) and Role:    * Alexis Frock, MD - Primary  PHYSICIAN ASSISTANT:   ASSISTANTS: none   ANESTHESIA:   general  EBL:  0 mL   BLOOD ADMINISTERED:none  DRAINS: none   LOCAL MEDICATIONS USED:  NONE  SPECIMEN:  No Specimen  DISPOSITION OF SPECIMEN:  N/A  COUNTS:  YES  TOURNIQUET:  * No tourniquets in log *  DICTATION: .Other Dictation: Dictation Number [id number]  PLAN OF CARE: Discharge to home after PACU  PATIENT DISPOSITION:  PACU - hemodynamically stable.   Delay start of Pharmacological VTE agent (>24hrs) due to surgical blood loss or risk of bleeding: yes

## 2017-08-14 NOTE — Anesthesia Postprocedure Evaluation (Signed)
Anesthesia Post Note  Patient: Willie Jackson  Procedure(s) Performed: RADIOACTIVE SEED IMPLANT/BRACHYTHERAPY IMPLANT (N/A ) SPACE OAR INSTILLATION (N/A )     Patient location during evaluation: PACU Anesthesia Type: General Level of consciousness: awake and alert and oriented Pain management: pain level controlled Vital Signs Assessment: post-procedure vital signs reviewed and stable Respiratory status: spontaneous breathing, nonlabored ventilation and respiratory function stable Cardiovascular status: blood pressure returned to baseline and stable Postop Assessment: no apparent nausea or vomiting Anesthetic complications: no    Last Vitals:  Vitals:   08/14/17 1620 08/14/17 1630  BP:  (!) 174/90  Pulse: 82 82  Resp: (!) 22 13  Temp:    SpO2: 95% 94%    Last Pain:  Vitals:   08/14/17 1615  TempSrc:   PainSc: 0-No pain                 [place] Landgrebe A.

## 2017-08-14 NOTE — H&P (Signed)
Willie Jackson is an 67 y.o. male.    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 TRUS 29mL, no median lobe.    PMH sig for HLD, HTN, mild obesity, umbilical and bilateral inguinal hernia repairs, high ETOH intake. He works on [employer] ([employer], [employer]). His PCP is Domenick Gong MD.   Today "Savior" is seen to proceed with prostate brachytherapy and SPACE-OAR placement. NO interval fevers.    Past Medical History:  Diagnosis Date  . ED (erectile dysfunction)   . Hypercholesterolemia   . Hypertension   . Nocturia   . Prostate cancer [facility]) urologist-  dr Tresa Moore  oncologist-  dr Tammi Klippel   dx 04-08-2017-- Stage T1c, Gleason 3+4, PSA 17.1-- scheduled for radioactive seed implants    Past Surgical History:  Procedure Laterality Date  . INGUINAL HERNIA REPAIR Right 1973  . PROSTATE BIOPSY  04-08-2017  dr Tresa Moore office  . UMBILICAL HERNIA REPAIR  2008   and LEFT INGUINAL HERNIA REPAIR    Family History  Problem Relation Age of Onset  . Lung cancer Mother   . Throat cancer Father    Social History:  reports that he quit smoking about 23 years ago. His smoking use included cigarettes. He has a 41.00 pack-year smoking history. He has never used smokeless tobacco. He reports that he drinks alcohol. He reports that he does not use drugs.  Allergies: No Known Allergies  No medications prior to admission.    No results found for this or any previous visit (from the past 48 hour(s)). No results found.  Review of Systems  Constitutional: Negative.  Negative for chills and fever.  HENT: Negative.   Eyes: Negative.   Respiratory: Negative.   Cardiovascular: Negative.   Gastrointestinal: Negative.   Genitourinary: Negative.  Negative for hematuria.  Musculoskeletal:  Negative.   Skin: Negative.   Neurological: Negative.   Endo/Heme/Allergies: Negative.   Psychiatric/Behavioral: Negative.     Height 5\' 7"  (1.702 m), weight 81.6 kg (180 lb). Physical Exam  Constitutional: He appears well-developed.  HENT:  Head: Normocephalic.  Eyes: Pupils are equal, round, and reactive to light.  Neck: Normal range of motion.  Respiratory: Effort normal.  GI: Soft.  Stable mild truncal obesity.   Genitourinary:  Genitourinary Comments: No CVAT  Musculoskeletal: Normal range of motion.  Neurological: He is alert.  Skin: Skin is warm.  Psychiatric: He has a normal mood and affect.     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 Risks, benefits, alternatives, expected per-op course discussed extensively previously and reiterated today.   Alexis Frock, MD 08/14/2017, 6:53 AM

## 2017-08-14 NOTE — Op Note (Signed)
NAME: Willie Jackson, HAMBLIN MEDICAL RECORD [id number] ACCOUNT [id number] DATE OF BIRTH:1950-08-21 FACILITY: [facility] LOCATION: [facility] PHYSICIAN:Zaylin Pistilli Tresa Moore, MD  OPERATIVE REPORT  DATE OF PROCEDURE:  08/14/2017  PREOPERATIVE DIAGNOSIS:  Moderate risk prostate cancer.  PROCEDURE PERFORMED: 1.  Radioactive brachytherapy seed implantation. 2.  Intraoperative fluoroscopy. 3.  Spacer gel placement. 4.  Cystoscopy.  ESTIMATED BLOOD LOSS:  Nil.  COMPLICATIONS:  None.  SPECIMENS:  None.  RADIATION SEED PARAMETERS:   145 Gy distributed over 82 seeds delivered in 25 catheters.  FINDINGS: 1.  Excellent placement of all seeds. 2.  No intraluminal seeds within the bladder.  Urethra Flexible cystoscopy. 3.  Excellent placement of gel matrix spacer with posterior displacement of the anterior rectal wall away from the prostate.  SURGEON:  Alexis Frock, MD  ASSISTANT:  Tyler Pita, MD  INDICATIONS:  The patient is a very pleasant 67 year old gentleman who was found on workup of elevated PSA to have a moderate risk adenocarcinoma of the prostate.  He has elected primary therapy with brachytherapy as his primary goal is a treatment with  minimal downtime.  He has weighed options extensively and wishes to proceed with curative intent.  Informed consent was then placed in medical record.  PROCEDURE PERFORMED:  The patient identified as [name], procedure being brachytherapy seed implantation, spacer placement and cystoscopy was confirmed.  Procedure timeout was performed.  Antibiotics administered.  General anesthesia introduced.   The patient was placed into a medium lithotomy position and the transrectal ultrasound probe was introduced per rectum.  The radiation seed grade was applied as were anchor needles into the prostate.  Next, as per prescribed plan, 25 catheters were used  to deliver 82 seeds beginning in an anterior direction proceeding to posterior direction to avoid  interference of acoustic window with transrectal ultrasound.  Following successful placement of all 25 catheters, the stepper was taken off of the anterior  traction to allow further visualization of anterior perirectal fat and the spacer final needle was introduced to mid gland and to anterior perirectal fat.  A test injection of 2 mL of saline was introduced which corroborated good placement.  Next, 10 cc  of gel matrix were delivered over 10 seconds as per manufacturer's instructions, which revealed an excellent displacement of the anterior rectal wall posteriorly away from the posterior aspect of the prostate.  The grid apparatus was taken down.  The  penis was prepped with iodine and cystourethroscopy was performed using a 16-French flexible cystoscope.  Flexible cystoscopy revealed no evidence of intraluminal seeds, very mild bilobar prostatic hypertrophy, mild bladder trabeculation, ureteral orifices were singleton bilaterally without papillary lesions.  Retroflexion revealed no additional findings.   The bladder was purposely partially left full and the procedure was terminated.    The patient was doing well.  No immediate periprocedure complications.    The patient was taken to the [facility] in stable condition.  AN/NUANCE  D:08/14/2017 T:08/14/2017 JOB:[id number]

## 2017-08-15 NOTE — Progress Notes (Signed)
  Radiation Oncology         [phone]) [phone] ________________________________  Name: Willie Jackson MRN: [id number]  Date: 08/15/2017  DOB: 29-Jun-1950       Prostate Seed Implant  OB:SJGGEZM, Fransico Him, MD  No ref. provider found  DIAGNOSIS: 67 y.o. gentleman with Stage T1c adenocarcinoma of the prostate with Gleason Score of 3+4, and PSA of 17.1    ICD-10-CM   1. Pre-op testing Z01.818 DG Chest 2 View    DG Chest 2 View    PROCEDURE: Insertion of radioactive I-125 seeds into the prostate gland.  RADIATION DOSE: 145 Gy, definitive therapy.  TECHNIQUE: Willie Jackson was brought to the operating room with the urologist. He was placed in the dorsolithotomy position. He was catheterized and a rectal tube was inserted. The perineum was shaved, prepped and draped. The ultrasound probe was then introduced into the rectum to see the prostate gland.  TREATMENT DEVICE: A needle grid was attached to the ultrasound probe stand and anchor needles were placed.  3D PLANNING: The prostate was imaged in 3D using a sagittal sweep of the prostate probe. These images were transferred to the planning computer. There, the prostate, urethra and rectum were defined on each axial reconstructed image. Then, the software created an optimized 3D plan and a few seed positions were adjusted. The quality of the plan was reviewed using [facility] information for the target and the following two organs at risk:  Urethra and Rectum.  Then the accepted plan was printed and handed off to the radiation therapist.  Under my supervision, the custom loading of the seeds and spacers was carried out and loaded into sealed vicryl sleeves.  These pre-loaded needles were then placed into the needle holder.Marland Kitchen  PROSTATE VOLUME STUDY:  Using transrectal ultrasound the volume of the prostate was verified to be 34.65 cc.  SPECIAL TREATMENT PROCEDURE/SUPERVISION AND HANDLING: The pre-loaded needles were then delivered under sagittal guidance. A  total of 25 needles were used to deposit 82 seeds in the prostate gland. The individual seed activity was 0.356 mCi.  SpaceOAR:  Yes  COMPLEX SIMULATION: At the end of the procedure, an anterior radiograph of the pelvis was obtained to document seed positioning and count. Cystoscopy was performed to check the urethra and bladder.  MICRODOSIMETRY: At the end of the procedure, the patient was emitting 0.07 mR/hr at 1 meter. Accordingly, he was considered safe for hospital discharge.  PLAN: The patient will return to the radiation oncology clinic for post implant CT dosimetry in three weeks.   ________________________________  Willie Jackson, M.D.

## 2017-08-19 ENCOUNTER — Encounter (HOSPITAL_BASED_OUTPATIENT_CLINIC_OR_DEPARTMENT_OTHER): Payer: Self-pay | Admitting: Urology

## 2017-08-29 ENCOUNTER — Telehealth: Payer: Self-pay | Admitting: *Deleted

## 2017-08-29 NOTE — Progress Notes (Signed)
Radiation Oncology         [phone]) [phone] ________________________________  Name: Willie Jackson MRN: [id number]  Date: 08/30/2017  DOB: 02-09-1951  Post-Seed Follow-Up Visit Note  CC: Tisovec, Fransico Him, MD  Alexis Frock, MD  Diagnosis:   67 y.o. gentleman with Stage T1c adenocarcinoma of the prostate with Gleason Score of 3+4, and PSA of 17.1    ICD-10-CM   1. Malignant neoplasm of prostate ([place]) C61     Narrative:  The patient returns today for routine follow-up.  He is complaining of increased urinary frequency and urinary hesitation symptoms. He filled out a questionnaire regarding urinary function today providing and overall IPSS score of 18 characterizing his symptoms as moderate.  He reports that his urinary symptoms are gradually improving and tolerable.  He continues with mild dysuria but denies gross hematuria or incontinence.  He is no longer taking Flomax as this caused orthostatic hypotension and dizziness.  He denies any bowel symptoms.  ALLERGIES:  has No Known Allergies.  Meds: Current Outpatient Medications  Medication Sig Dispense Refill  . atorvastatin (LIPITOR) 20 MG tablet Take 20 mg by mouth every morning.     . olmesartan (BENICAR) 20 MG tablet Take 20 mg by mouth every morning.     . senna-docusate (SENOKOT-S) 8.6-50 MG tablet Take 1 tablet by mouth 2 (two) times daily. While taking stong pain meds to prevent constipation 20 tablet 0  . sildenafil (REVATIO) 20 MG tablet TK 1 TO 3 TS PO 1 HOUR BEFORE INTERCOURSE  11  . tamsulosin (FLOMAX) 0.4 MG CAPS capsule Take 1 capsule (0.4 mg total) by mouth daily as needed. For urinary urgency or weak stream after prostate radiation 30 capsule 11  . traMADol (ULTRAM) 50 MG tablet Take 1-2 tablets (50-100 mg total) by mouth every 6 (six) hours as needed for moderate pain or severe pain. Post-operatively 20 tablet 0   No current facility-administered medications for this encounter.     Physical Findings: In general  this is a well appearing Caucasian male in no acute distress.  He's alert and oriented x4 and appropriate throughout the examination. Cardiopulmonary assessment is negative for acute distress and he exhibits normal effort.   Lab Findings: Lab Results  Component Value Date   WBC 7.7 08/07/2017   HGB 14.3 08/07/2017   HCT 41.1 08/07/2017   MCV 91.7 08/07/2017   PLT 220 08/07/2017    Radiographic Findings:  Patient underwent CT imaging in our clinic for post implant dosimetry. The CT was reviewed by Dr. Tammi Klippel and appears to demonstrate an adequate distribution of radioactive seeds throughout the prostate gland. There are no seeds in or near the rectum.  He is scheduled for an MRI prostate at 7 PM this evening.  These images will be fused with the CT images for further evaluation.  We suspect the final radiation plan and dosimetry will show appropriate coverage of the prostate gland.   Impression/Plan: The patient is recovering from the effects of radiation. His urinary symptoms should gradually improve over the next 4-6 months. We talked about this today. He is encouraged by his improvement already and is otherwise pleased with his outcome. We also talked about long-term follow-up for prostate cancer following seed implant. He understands that ongoing PSA determinations and digital rectal exams will help perform surveillance to rule out disease recurrence. He does not currently have a follow up appointment scheduled with Dr. Tresa Moore.  I advised him to call [facility] for a follow-up  appointment with Dr. Tresa Moore in September 2019 if he is not heard from Dr. Zettie Pho nurse by the middle part of next week.  He understands what to expect with his PSA measures. Patient was also educated today about some of the long-term effects from radiation including a small risk for rectal bleeding and possibly erectile dysfunction. We talked about some of the general management approaches to these potential  complications. However, I did encourage the patient to contact our office or return at any point if he has questions or concerns related to his previous radiation and prostate cancer.    Nicholos Johns, PA-C    Tyler Pita, MD  [facility] Direct Dial: [phone]  Fax: [phone] [email or web address]  Skype  [email or web address]  This document serves as a record of services personally performed by Tyler Pita, MD and Nicholos Johns, PA-C. It was created on their behalf by Margit Banda, a trained medical scribe. The creation of this record is based on the scribe's personal observations and the provider's statements to them. This document has been checked and approved by the attending provider.

## 2017-08-29 NOTE — Progress Notes (Signed)
  Radiation Oncology         [phone] ________________________________  Name: [name] MRN: [id number]  Date: 08/30/2017  DOB: 11-26-1950  COMPLEX SIMULATION NOTE  NARRATIVE:  The patient was brought to the [facility] today following prostate seed implantation approximately one month ago.  Identity was confirmed.  All relevant records and images related to the planned course of therapy were reviewed.  Then, the patient was set-up supine.  CT images were obtained.  The CT images were loaded into the planning software.  Then the prostate and rectum were contoured.  Treatment planning then occurred.  The implanted iodine 125 seeds were identified by the physics staff for projection of radiation distribution  I have requested : 3D Simulation  I have requested a DVH of the following structures: Prostate and rectum.    ________________________________  Sheral Apley Tammi Klippel, M.D.  This document serves as a record of services personally performed by Tyler Pita, MD. It was created on his behalf by Margit Banda, a trained medical scribe. The creation of this record is based on the scribe's personal observations and the provider's statements to them. This document has been checked and approved by the attending provider.

## 2017-08-29 NOTE — Telephone Encounter (Signed)
CALLED PATIENT TO REMIND OF POST SEED APPTS. AND HIS MRI FOR 08-30-17, SPOKE WITH PATIENT AND HE IS AWARE OF THESE APPTS.

## 2017-08-30 ENCOUNTER — Ambulatory Visit
Admission: RE | Admit: 2017-08-30 | Discharge: 2017-08-30 | Disposition: A | Discharge status: Home / Self Care | Payer: PPO | Source: Ambulatory Visit | Attending: Radiation Oncology | Admitting: Radiation Oncology

## 2017-08-30 ENCOUNTER — Encounter: Payer: Self-pay | Admitting: Medical Oncology

## 2017-08-30 ENCOUNTER — Encounter: Payer: Self-pay | Admitting: Radiation Oncology

## 2017-08-30 ENCOUNTER — Ambulatory Visit (HOSPITAL_COMMUNITY)
Admission: RE | Admit: 2017-08-30 | Discharge: 2017-08-30 | Disposition: A | Discharge status: Home / Self Care | Payer: PPO | Source: Ambulatory Visit | Attending: Urology | Admitting: Urology

## 2017-08-30 VITALS — BP 154/84 | HR 73 | Temp 98.3°F | Resp 20 | Ht 67.0 in | Wt 180.8 lb

## 2017-08-30 DIAGNOSIS — C61 Malignant neoplasm of prostate: Secondary | ICD-10-CM | POA: Insufficient documentation

## 2017-08-30 NOTE — Progress Notes (Signed)
Received patient in the clinic for post seed follow up with Dr. Tammi Klippel. Systolic bp slightly elevated. Denies pain. Weight stable. Post seed IPSS 19. Reports urinary hesitancy. Reports mild dysuria. Denies hematuria, urinary leakage or incontinence. Denies any bowel complaints. Do not have a follow up appointment scheduled with urology. MRI to confirm SpaceOar placement at 7 pm on 08/30/2017.   BP (!) 154/84   Pulse 73   Temp 98.3 F (36.8 C) (Oral)   Resp 20   Wt 180 lb 12.8 oz (82 kg)   SpO2 99%   BMI 28.32 kg/m

## 2017-09-27 ENCOUNTER — Encounter: Payer: Self-pay | Admitting: Radiation Oncology

## 2017-09-27 DIAGNOSIS — C61 Malignant neoplasm of prostate: Secondary | ICD-10-CM | POA: Diagnosis not present

## 2017-09-28 NOTE — Progress Notes (Signed)
  Radiation Oncology         [phone]) [phone] ________________________________  Name: Willie Jackson MRN: [id number]  Date: 09/27/2017  DOB: March 13, 1950  3D Planning Note   Prostate Brachytherapy Post-Implant Dosimetry  Diagnosis: 67 y.o. male with Stage T1c adenocarcinoma of the prostate with Gleason Score of 3+4, and PSA of 17.1  Narrative: On a previous date, Willie Jackson returned following prostate seed implantation for post implant planning. He underwent CT scan complex simulation to delineate the three-dimensional structures of the pelvis and demonstrate the radiation distribution.  Since that time, the seed localization, and complex isodose planning with dose volume histograms have now been completed.  Results:   Prostate Coverage - The dose of radiation delivered to the 90% or more of the prostate gland (D90) was 112.83% of the prescription dose. This exceeds our goal of greater than 90%. Rectal Sparing - The volume of rectal tissue receiving the prescription dose or higher was 0.0 cc. This falls under our thresholds tolerance of 1.0 cc.  Impression: The prostate seed implant appears to show adequate target coverage and appropriate rectal sparing.  Plan:  The patient will continue to follow with urology for ongoing PSA determinations. I would anticipate a high likelihood for local tumor control with minimal risk for rectal morbidity.  ________________________________  Sheral Apley Tammi Klippel, M.D.

## 2017-10-10 DIAGNOSIS — C61 Malignant neoplasm of prostate: Secondary | ICD-10-CM | POA: Diagnosis not present

## 2017-10-10 DIAGNOSIS — F1010 Alcohol abuse, uncomplicated: Secondary | ICD-10-CM | POA: Diagnosis not present

## 2017-10-10 DIAGNOSIS — Z23 Encounter for immunization: Secondary | ICD-10-CM | POA: Diagnosis not present

## 2017-10-10 DIAGNOSIS — F5221 Male erectile disorder: Secondary | ICD-10-CM | POA: Diagnosis not present

## 2017-10-10 DIAGNOSIS — Z6828 Body mass index (BMI) 28.0-28.9, adult: Secondary | ICD-10-CM | POA: Diagnosis not present

## 2017-10-10 DIAGNOSIS — E663 Overweight: Secondary | ICD-10-CM | POA: Diagnosis not present

## 2017-10-10 DIAGNOSIS — E7800 Pure hypercholesterolemia, unspecified: Secondary | ICD-10-CM | POA: Diagnosis not present

## 2017-10-10 DIAGNOSIS — I10 Essential (primary) hypertension: Secondary | ICD-10-CM | POA: Diagnosis not present

## 2017-11-14 DIAGNOSIS — C61 Malignant neoplasm of prostate: Secondary | ICD-10-CM | POA: Diagnosis not present

## 2017-11-14 DIAGNOSIS — N5201 Erectile dysfunction due to arterial insufficiency: Secondary | ICD-10-CM | POA: Diagnosis not present

## 2018-02-17 DIAGNOSIS — Z125 Encounter for screening for malignant neoplasm of prostate: Secondary | ICD-10-CM | POA: Diagnosis not present

## 2018-02-17 DIAGNOSIS — E7800 Pure hypercholesterolemia, unspecified: Secondary | ICD-10-CM | POA: Diagnosis not present

## 2018-02-17 DIAGNOSIS — I10 Essential (primary) hypertension: Secondary | ICD-10-CM | POA: Diagnosis not present

## 2018-02-17 DIAGNOSIS — R82998 Other abnormal findings in urine: Secondary | ICD-10-CM | POA: Diagnosis not present

## 2018-02-24 DIAGNOSIS — C61 Malignant neoplasm of prostate: Secondary | ICD-10-CM | POA: Diagnosis not present

## 2018-02-24 DIAGNOSIS — I10 Essential (primary) hypertension: Secondary | ICD-10-CM | POA: Diagnosis not present

## 2018-02-24 DIAGNOSIS — Z23 Encounter for immunization: Secondary | ICD-10-CM | POA: Diagnosis not present

## 2018-02-24 DIAGNOSIS — Z1389 Encounter for screening for other disorder: Secondary | ICD-10-CM | POA: Diagnosis not present

## 2018-02-24 DIAGNOSIS — E7800 Pure hypercholesterolemia, unspecified: Secondary | ICD-10-CM | POA: Diagnosis not present

## 2018-02-24 DIAGNOSIS — Z6827 Body mass index (BMI) 27.0-27.9, adult: Secondary | ICD-10-CM | POA: Diagnosis not present

## 2018-02-24 DIAGNOSIS — F1010 Alcohol abuse, uncomplicated: Secondary | ICD-10-CM | POA: Diagnosis not present

## 2018-02-24 DIAGNOSIS — E663 Overweight: Secondary | ICD-10-CM | POA: Diagnosis not present

## 2018-02-24 DIAGNOSIS — Z0000 Encounter for general adult medical examination without abnormal findings: Secondary | ICD-10-CM | POA: Diagnosis not present

## 2018-02-24 DIAGNOSIS — F5221 Male erectile disorder: Secondary | ICD-10-CM | POA: Diagnosis not present

## 2018-02-25 DIAGNOSIS — Z1212 Encounter for screening for malignant neoplasm of rectum: Secondary | ICD-10-CM | POA: Diagnosis not present

## 2018-03-04 DIAGNOSIS — H524 Presbyopia: Secondary | ICD-10-CM | POA: Diagnosis not present

## 2018-03-04 DIAGNOSIS — H2513 Age-related nuclear cataract, bilateral: Secondary | ICD-10-CM | POA: Diagnosis not present

## 2018-06-02 DIAGNOSIS — N5201 Erectile dysfunction due to arterial insufficiency: Secondary | ICD-10-CM | POA: Diagnosis not present

## 2018-06-02 DIAGNOSIS — C61 Malignant neoplasm of prostate: Secondary | ICD-10-CM | POA: Diagnosis not present

## 2018-09-04 DIAGNOSIS — I10 Essential (primary) hypertension: Secondary | ICD-10-CM | POA: Diagnosis not present

## 2018-09-04 DIAGNOSIS — F1010 Alcohol abuse, uncomplicated: Secondary | ICD-10-CM | POA: Diagnosis not present

## 2018-09-04 DIAGNOSIS — C61 Malignant neoplasm of prostate: Secondary | ICD-10-CM | POA: Diagnosis not present

## 2018-09-04 DIAGNOSIS — F5221 Male erectile disorder: Secondary | ICD-10-CM | POA: Diagnosis not present

## 2018-09-04 DIAGNOSIS — E7800 Pure hypercholesterolemia, unspecified: Secondary | ICD-10-CM | POA: Diagnosis not present

## 2018-09-04 DIAGNOSIS — E663 Overweight: Secondary | ICD-10-CM | POA: Diagnosis not present

## 2018-11-25 DIAGNOSIS — C61 Malignant neoplasm of prostate: Secondary | ICD-10-CM | POA: Diagnosis not present

## 2018-12-02 DIAGNOSIS — C61 Malignant neoplasm of prostate: Secondary | ICD-10-CM | POA: Diagnosis not present

## 2018-12-02 DIAGNOSIS — N5201 Erectile dysfunction due to arterial insufficiency: Secondary | ICD-10-CM | POA: Diagnosis not present

## 2019-02-23 DIAGNOSIS — E7800 Pure hypercholesterolemia, unspecified: Secondary | ICD-10-CM | POA: Diagnosis not present

## 2019-02-27 DIAGNOSIS — E7800 Pure hypercholesterolemia, unspecified: Secondary | ICD-10-CM | POA: Diagnosis not present

## 2019-03-02 DIAGNOSIS — Z1331 Encounter for screening for depression: Secondary | ICD-10-CM | POA: Diagnosis not present

## 2019-03-02 DIAGNOSIS — I10 Essential (primary) hypertension: Secondary | ICD-10-CM | POA: Diagnosis not present

## 2019-03-02 DIAGNOSIS — Z1339 Encounter for screening examination for other mental health and behavioral disorders: Secondary | ICD-10-CM | POA: Diagnosis not present

## 2019-03-02 DIAGNOSIS — C61 Malignant neoplasm of prostate: Secondary | ICD-10-CM | POA: Diagnosis not present

## 2019-03-02 DIAGNOSIS — E875 Hyperkalemia: Secondary | ICD-10-CM | POA: Diagnosis not present

## 2019-03-02 DIAGNOSIS — E7800 Pure hypercholesterolemia, unspecified: Secondary | ICD-10-CM | POA: Diagnosis not present

## 2019-03-02 DIAGNOSIS — E663 Overweight: Secondary | ICD-10-CM | POA: Diagnosis not present

## 2019-03-02 DIAGNOSIS — Z0000 Encounter for general adult medical examination without abnormal findings: Secondary | ICD-10-CM | POA: Diagnosis not present

## 2019-03-02 DIAGNOSIS — F5221 Male erectile disorder: Secondary | ICD-10-CM | POA: Diagnosis not present

## 2019-03-02 DIAGNOSIS — F1010 Alcohol abuse, uncomplicated: Secondary | ICD-10-CM | POA: Diagnosis not present

## 2019-05-22 DIAGNOSIS — N486 Induration penis plastica: Secondary | ICD-10-CM | POA: Diagnosis not present

## 2019-05-22 DIAGNOSIS — N5201 Erectile dysfunction due to arterial insufficiency: Secondary | ICD-10-CM | POA: Diagnosis not present

## 2019-05-22 DIAGNOSIS — C61 Malignant neoplasm of prostate: Secondary | ICD-10-CM | POA: Diagnosis not present

## 2019-08-24 DIAGNOSIS — I10 Essential (primary) hypertension: Secondary | ICD-10-CM | POA: Diagnosis not present

## 2019-08-24 DIAGNOSIS — C61 Malignant neoplasm of prostate: Secondary | ICD-10-CM | POA: Diagnosis not present

## 2019-08-24 DIAGNOSIS — E663 Overweight: Secondary | ICD-10-CM | POA: Diagnosis not present

## 2019-08-24 DIAGNOSIS — F5221 Male erectile disorder: Secondary | ICD-10-CM | POA: Diagnosis not present

## 2019-08-24 DIAGNOSIS — N486 Induration penis plastica: Secondary | ICD-10-CM | POA: Diagnosis not present

## 2019-08-24 DIAGNOSIS — E7800 Pure hypercholesterolemia, unspecified: Secondary | ICD-10-CM | POA: Diagnosis not present

## 2019-08-26 DIAGNOSIS — E876 Hypokalemia: Secondary | ICD-10-CM | POA: Diagnosis not present

## 2019-08-28 DIAGNOSIS — N179 Acute kidney failure, unspecified: Secondary | ICD-10-CM | POA: Diagnosis not present

## 2019-08-28 DIAGNOSIS — E875 Hyperkalemia: Secondary | ICD-10-CM | POA: Diagnosis not present

## 2019-08-28 DIAGNOSIS — E876 Hypokalemia: Secondary | ICD-10-CM | POA: Diagnosis not present

## 2019-08-28 DIAGNOSIS — I10 Essential (primary) hypertension: Secondary | ICD-10-CM | POA: Diagnosis not present

## 2019-09-28 DIAGNOSIS — I10 Essential (primary) hypertension: Secondary | ICD-10-CM | POA: Diagnosis not present

## 2019-09-28 DIAGNOSIS — E875 Hyperkalemia: Secondary | ICD-10-CM | POA: Diagnosis not present

## 2019-11-30 DIAGNOSIS — E875 Hyperkalemia: Secondary | ICD-10-CM | POA: Diagnosis not present

## 2019-11-30 DIAGNOSIS — I10 Essential (primary) hypertension: Secondary | ICD-10-CM | POA: Diagnosis not present

## 2019-12-28 DIAGNOSIS — N486 Induration penis plastica: Secondary | ICD-10-CM | POA: Diagnosis not present

## 2019-12-28 DIAGNOSIS — N5201 Erectile dysfunction due to arterial insufficiency: Secondary | ICD-10-CM | POA: Diagnosis not present

## 2019-12-28 DIAGNOSIS — C61 Malignant neoplasm of prostate: Secondary | ICD-10-CM | POA: Diagnosis not present

## 2020-03-03 DIAGNOSIS — Z125 Encounter for screening for malignant neoplasm of prostate: Secondary | ICD-10-CM | POA: Diagnosis not present

## 2020-03-03 DIAGNOSIS — E7800 Pure hypercholesterolemia, unspecified: Secondary | ICD-10-CM | POA: Diagnosis not present

## 2020-03-07 DIAGNOSIS — E663 Overweight: Secondary | ICD-10-CM | POA: Diagnosis not present

## 2020-03-07 DIAGNOSIS — N486 Induration penis plastica: Secondary | ICD-10-CM | POA: Diagnosis not present

## 2020-03-07 DIAGNOSIS — C61 Malignant neoplasm of prostate: Secondary | ICD-10-CM | POA: Diagnosis not present

## 2020-03-07 DIAGNOSIS — I10 Essential (primary) hypertension: Secondary | ICD-10-CM | POA: Diagnosis not present

## 2020-03-07 DIAGNOSIS — F1010 Alcohol abuse, uncomplicated: Secondary | ICD-10-CM | POA: Diagnosis not present

## 2020-03-07 DIAGNOSIS — Z0000 Encounter for general adult medical examination without abnormal findings: Secondary | ICD-10-CM | POA: Diagnosis not present

## 2020-03-07 DIAGNOSIS — R82998 Other abnormal findings in urine: Secondary | ICD-10-CM | POA: Diagnosis not present

## 2020-03-07 DIAGNOSIS — E7800 Pure hypercholesterolemia, unspecified: Secondary | ICD-10-CM | POA: Diagnosis not present

## 2020-03-07 DIAGNOSIS — F5221 Male erectile disorder: Secondary | ICD-10-CM | POA: Diagnosis not present

## 2020-03-07 DIAGNOSIS — Z23 Encounter for immunization: Secondary | ICD-10-CM | POA: Diagnosis not present

## 2020-04-08 IMAGING — DX DG CHEST 2V
2 series · 2 of 2 positions shown · non-contrast
Comparison: None.

CLINICAL DATA: Preoperative study prior to prostate seed implants.

EXAM:
CHEST - 2 VIEW

[chest pa]
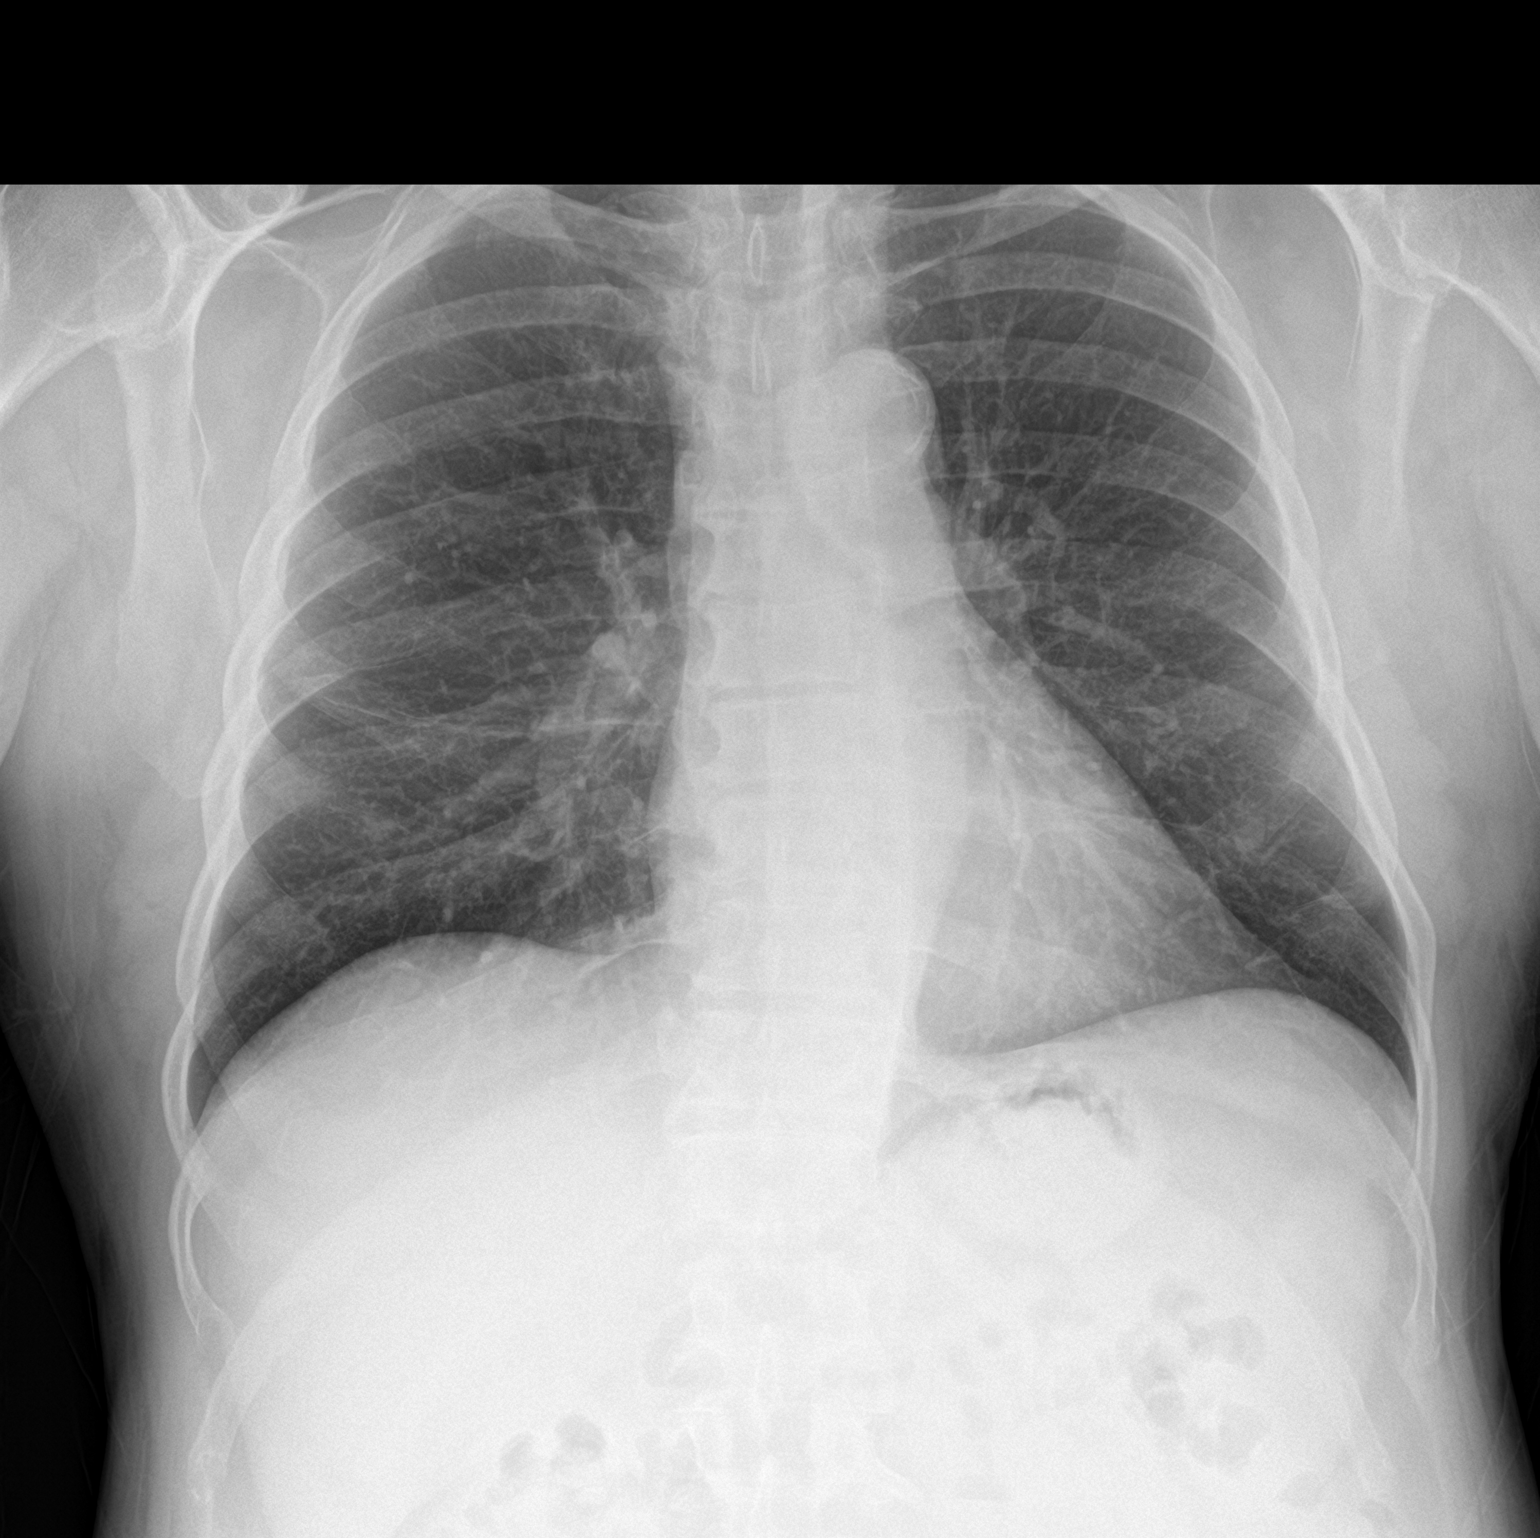

[chest lat]
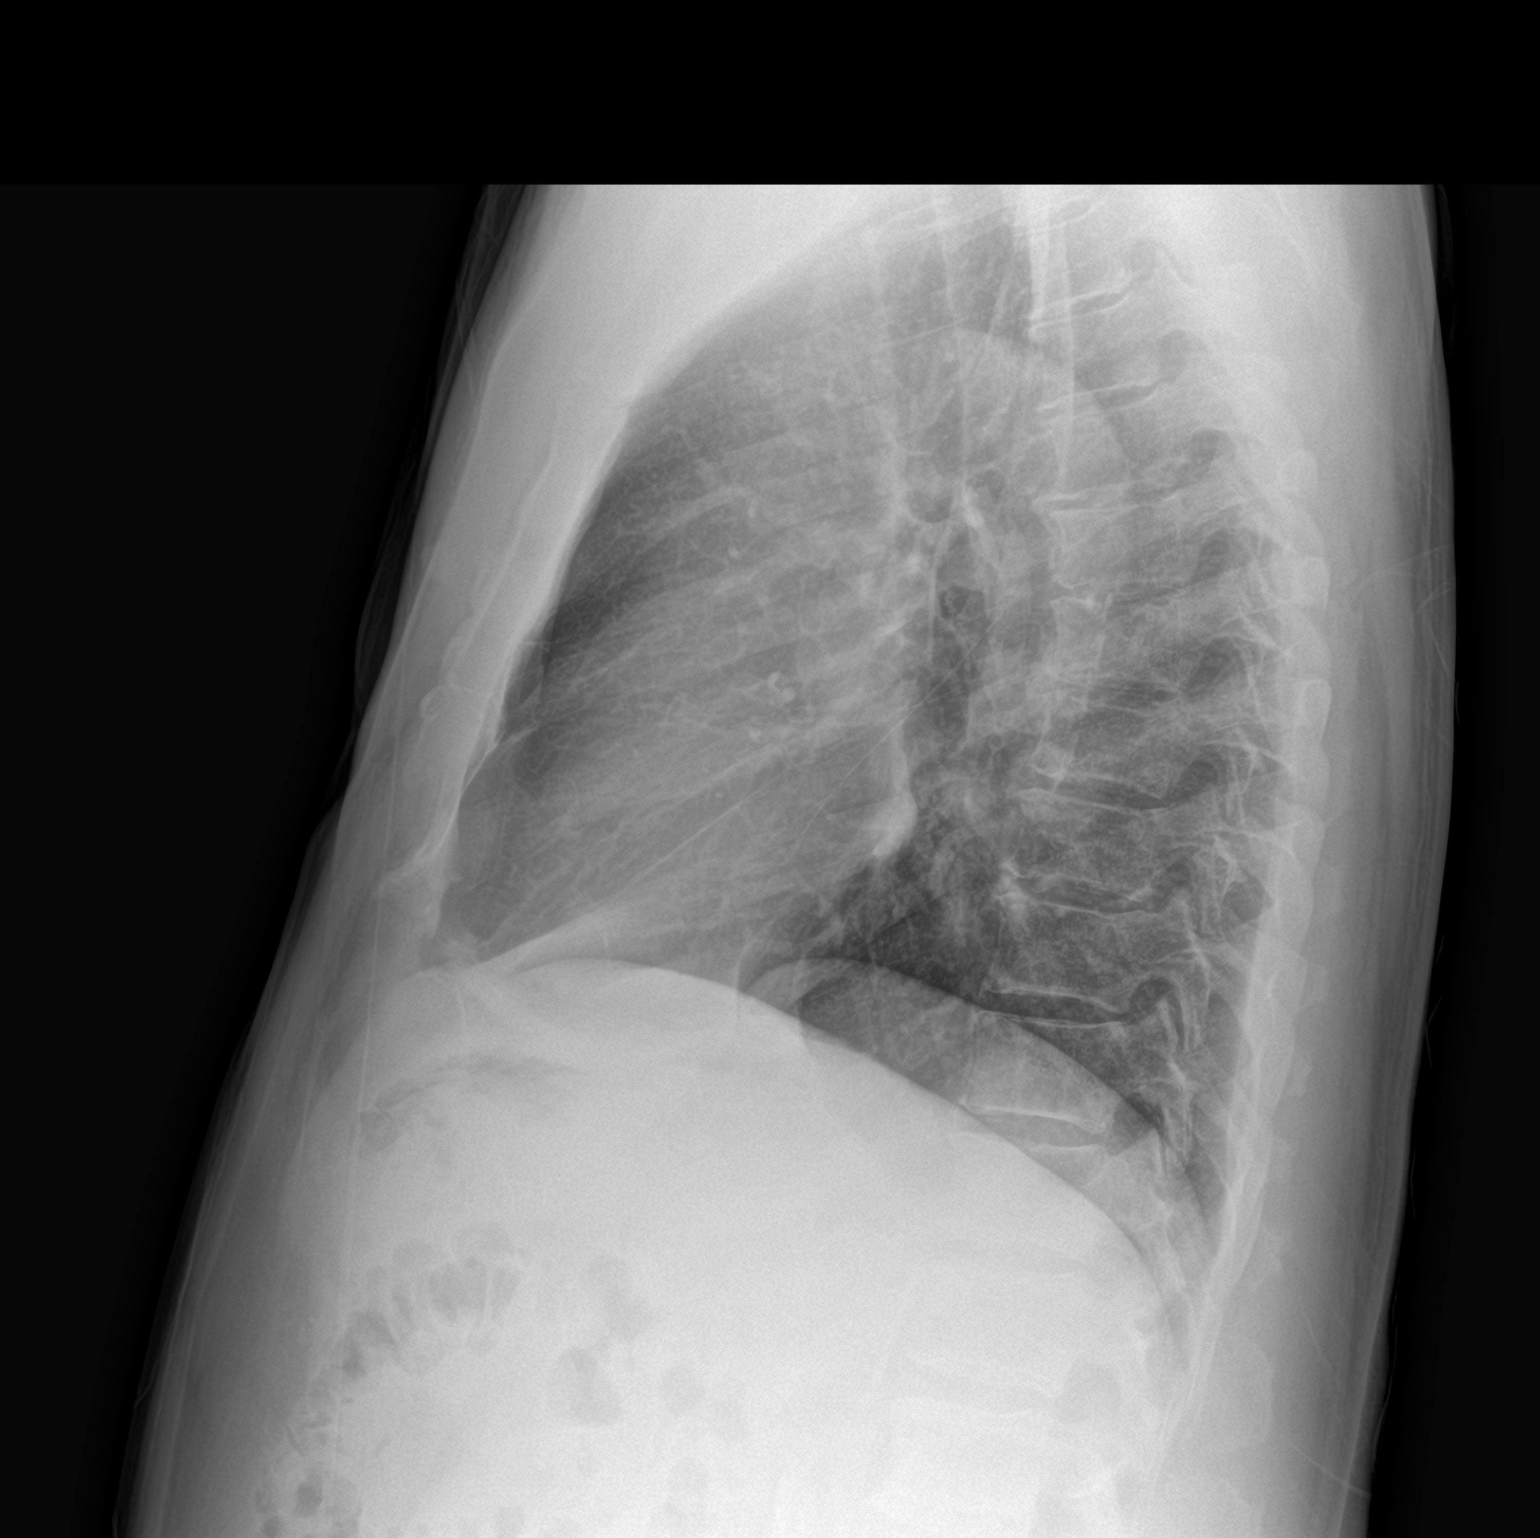

[2 of 2 positions shown; findings below may reference images not displayed]

FINDINGS: The heart size and mediastinal contours are within normal limits.
Both lungs are clear. The visualized skeletal structures are
unremarkable.
IMPRESSION: No active cardiopulmonary disease.

## 2020-06-20 DIAGNOSIS — C61 Malignant neoplasm of prostate: Secondary | ICD-10-CM | POA: Diagnosis not present

## 2020-06-27 DIAGNOSIS — N486 Induration penis plastica: Secondary | ICD-10-CM | POA: Diagnosis not present

## 2020-06-27 DIAGNOSIS — C61 Malignant neoplasm of prostate: Secondary | ICD-10-CM | POA: Diagnosis not present

## 2020-06-27 DIAGNOSIS — N5201 Erectile dysfunction due to arterial insufficiency: Secondary | ICD-10-CM | POA: Diagnosis not present

## 2020-09-08 DIAGNOSIS — E663 Overweight: Secondary | ICD-10-CM | POA: Diagnosis not present

## 2020-09-08 DIAGNOSIS — F1010 Alcohol abuse, uncomplicated: Secondary | ICD-10-CM | POA: Diagnosis not present

## 2020-09-08 DIAGNOSIS — F5221 Male erectile disorder: Secondary | ICD-10-CM | POA: Diagnosis not present

## 2020-09-08 DIAGNOSIS — C61 Malignant neoplasm of prostate: Secondary | ICD-10-CM | POA: Diagnosis not present

## 2020-09-08 DIAGNOSIS — I10 Essential (primary) hypertension: Secondary | ICD-10-CM | POA: Diagnosis not present

## 2020-09-08 DIAGNOSIS — N486 Induration penis plastica: Secondary | ICD-10-CM | POA: Diagnosis not present

## 2020-09-08 DIAGNOSIS — E7800 Pure hypercholesterolemia, unspecified: Secondary | ICD-10-CM | POA: Diagnosis not present

## 2021-03-10 DIAGNOSIS — I10 Essential (primary) hypertension: Secondary | ICD-10-CM | POA: Diagnosis not present

## 2021-03-10 DIAGNOSIS — Z125 Encounter for screening for malignant neoplasm of prostate: Secondary | ICD-10-CM | POA: Diagnosis not present

## 2021-03-10 DIAGNOSIS — E7800 Pure hypercholesterolemia, unspecified: Secondary | ICD-10-CM | POA: Diagnosis not present

## 2021-03-17 DIAGNOSIS — C61 Malignant neoplasm of prostate: Secondary | ICD-10-CM | POA: Diagnosis not present

## 2021-03-17 DIAGNOSIS — N486 Induration penis plastica: Secondary | ICD-10-CM | POA: Diagnosis not present

## 2021-03-17 DIAGNOSIS — E663 Overweight: Secondary | ICD-10-CM | POA: Diagnosis not present

## 2021-03-17 DIAGNOSIS — R82998 Other abnormal findings in urine: Secondary | ICD-10-CM | POA: Diagnosis not present

## 2021-03-17 DIAGNOSIS — E7800 Pure hypercholesterolemia, unspecified: Secondary | ICD-10-CM | POA: Diagnosis not present

## 2021-03-17 DIAGNOSIS — Z1339 Encounter for screening examination for other mental health and behavioral disorders: Secondary | ICD-10-CM | POA: Diagnosis not present

## 2021-03-17 DIAGNOSIS — F1010 Alcohol abuse, uncomplicated: Secondary | ICD-10-CM | POA: Diagnosis not present

## 2021-03-17 DIAGNOSIS — Z23 Encounter for immunization: Secondary | ICD-10-CM | POA: Diagnosis not present

## 2021-03-17 DIAGNOSIS — Z0000 Encounter for general adult medical examination without abnormal findings: Secondary | ICD-10-CM | POA: Diagnosis not present

## 2021-03-17 DIAGNOSIS — F5221 Male erectile disorder: Secondary | ICD-10-CM | POA: Diagnosis not present

## 2021-03-17 DIAGNOSIS — Z1331 Encounter for screening for depression: Secondary | ICD-10-CM | POA: Diagnosis not present

## 2021-03-17 DIAGNOSIS — I10 Essential (primary) hypertension: Secondary | ICD-10-CM | POA: Diagnosis not present

## 2021-03-18 DIAGNOSIS — Z1212 Encounter for screening for malignant neoplasm of rectum: Secondary | ICD-10-CM | POA: Diagnosis not present

## 2021-03-18 DIAGNOSIS — I10 Essential (primary) hypertension: Secondary | ICD-10-CM | POA: Diagnosis not present

## 2021-07-27 DIAGNOSIS — C61 Malignant neoplasm of prostate: Secondary | ICD-10-CM | POA: Diagnosis not present

## 2021-08-03 DIAGNOSIS — N486 Induration penis plastica: Secondary | ICD-10-CM | POA: Diagnosis not present

## 2021-08-03 DIAGNOSIS — N5201 Erectile dysfunction due to arterial insufficiency: Secondary | ICD-10-CM | POA: Diagnosis not present

## 2021-08-03 DIAGNOSIS — C61 Malignant neoplasm of prostate: Secondary | ICD-10-CM | POA: Diagnosis not present

## 2021-09-26 DIAGNOSIS — F1010 Alcohol abuse, uncomplicated: Secondary | ICD-10-CM | POA: Diagnosis not present

## 2021-09-26 DIAGNOSIS — N486 Induration penis plastica: Secondary | ICD-10-CM | POA: Diagnosis not present

## 2021-09-26 DIAGNOSIS — F5221 Male erectile disorder: Secondary | ICD-10-CM | POA: Diagnosis not present

## 2021-09-26 DIAGNOSIS — E663 Overweight: Secondary | ICD-10-CM | POA: Diagnosis not present

## 2021-09-26 DIAGNOSIS — I10 Essential (primary) hypertension: Secondary | ICD-10-CM | POA: Diagnosis not present

## 2021-09-26 DIAGNOSIS — E7800 Pure hypercholesterolemia, unspecified: Secondary | ICD-10-CM | POA: Diagnosis not present

## 2021-09-26 DIAGNOSIS — C61 Malignant neoplasm of prostate: Secondary | ICD-10-CM | POA: Diagnosis not present

## 2022-01-22 DIAGNOSIS — C61 Malignant neoplasm of prostate: Secondary | ICD-10-CM | POA: Diagnosis not present

## 2022-02-01 DIAGNOSIS — N486 Induration penis plastica: Secondary | ICD-10-CM | POA: Diagnosis not present

## 2022-02-01 DIAGNOSIS — N5201 Erectile dysfunction due to arterial insufficiency: Secondary | ICD-10-CM | POA: Diagnosis not present

## 2022-02-01 DIAGNOSIS — C61 Malignant neoplasm of prostate: Secondary | ICD-10-CM | POA: Diagnosis not present

## 2022-02-21 ENCOUNTER — Other Ambulatory Visit (HOSPITAL_COMMUNITY): Payer: Self-pay | Admitting: Urology

## 2022-02-21 DIAGNOSIS — C61 Malignant neoplasm of prostate: Secondary | ICD-10-CM

## 2022-03-08 ENCOUNTER — Encounter (HOSPITAL_COMMUNITY)
Admission: RE | Admit: 2022-03-08 | Discharge: 2022-03-08 | Disposition: A | Discharge status: Home / Self Care | Payer: PPO | Source: Ambulatory Visit | Attending: Urology | Admitting: Urology

## 2022-03-08 DIAGNOSIS — K4090 Unilateral inguinal hernia, without obstruction or gangrene, not specified as recurrent: Secondary | ICD-10-CM | POA: Diagnosis not present

## 2022-03-08 DIAGNOSIS — I700 Atherosclerosis of aorta: Secondary | ICD-10-CM | POA: Diagnosis not present

## 2022-03-08 DIAGNOSIS — C61 Malignant neoplasm of prostate: Secondary | ICD-10-CM | POA: Diagnosis not present

## 2022-03-08 MED ORDER — PIFLIFOLASTAT F 18 (PYLARIFY) INJECTION
9.0000 | Freq: Once | INTRAVENOUS | Status: AC
  Administered 2022-03-08: 9.4 via INTRAVENOUS

## 2022-03-26 DIAGNOSIS — C61 Malignant neoplasm of prostate: Secondary | ICD-10-CM | POA: Diagnosis not present

## 2022-03-26 DIAGNOSIS — N486 Induration penis plastica: Secondary | ICD-10-CM | POA: Diagnosis not present

## 2022-03-26 DIAGNOSIS — N5201 Erectile dysfunction due to arterial insufficiency: Secondary | ICD-10-CM | POA: Diagnosis not present

## 2022-03-28 ENCOUNTER — Other Ambulatory Visit: Payer: Self-pay | Admitting: Urology

## 2022-03-29 DIAGNOSIS — Z125 Encounter for screening for malignant neoplasm of prostate: Secondary | ICD-10-CM | POA: Diagnosis not present

## 2022-03-29 DIAGNOSIS — E7800 Pure hypercholesterolemia, unspecified: Secondary | ICD-10-CM | POA: Diagnosis not present

## 2022-03-29 DIAGNOSIS — I10 Essential (primary) hypertension: Secondary | ICD-10-CM | POA: Diagnosis not present

## 2022-03-30 ENCOUNTER — Encounter (HOSPITAL_BASED_OUTPATIENT_CLINIC_OR_DEPARTMENT_OTHER): Payer: Self-pay | Admitting: Urology

## 2022-03-30 NOTE — Progress Notes (Signed)
03/30/2022 11:41 AM Spoke w/ via phone for pre-op interview---patient Lab needs dos----   I Stat Chem 8, EKG            Lab results------in Epic COVID test -----patient states asymptomatic no test needed Arrive at -------1030 NPO after MN NO Solid Food.  Clear liquids from MN until---0830 Med rec completed Medications to take morning of surgery -----HOLD Olmesartan DOS Diabetic medication -----none Patient instructed no nail polish to be worn day of surgery Patient instructed to bring photo id and insurance card day of surgery Patient aware to have Driver (ride ) / caregiver    for 24 hours after surgery -Wife Lovey Newcomer [phone] Patient Special Instructions -----Pt. Reports regular daily ETOH consumption. Instructed no alcohol x 24 hr prior to surgery, pt. Verbalized understanding Pre-Op special Istructions -----none Patient verbalized understanding of instructions that were given at this phone interview. Patient denies shortness of breath, chest pain, fever, cough at this phone interview.  Willie Jackson, Willie Jackson

## 2022-04-04 DIAGNOSIS — Z1339 Encounter for screening examination for other mental health and behavioral disorders: Secondary | ICD-10-CM | POA: Diagnosis not present

## 2022-04-04 DIAGNOSIS — E663 Overweight: Secondary | ICD-10-CM | POA: Diagnosis not present

## 2022-04-04 DIAGNOSIS — C61 Malignant neoplasm of prostate: Secondary | ICD-10-CM | POA: Diagnosis not present

## 2022-04-04 DIAGNOSIS — F1010 Alcohol abuse, uncomplicated: Secondary | ICD-10-CM | POA: Diagnosis not present

## 2022-04-04 DIAGNOSIS — I10 Essential (primary) hypertension: Secondary | ICD-10-CM | POA: Diagnosis not present

## 2022-04-04 DIAGNOSIS — Z1331 Encounter for screening for depression: Secondary | ICD-10-CM | POA: Diagnosis not present

## 2022-04-04 DIAGNOSIS — F5221 Male erectile disorder: Secondary | ICD-10-CM | POA: Diagnosis not present

## 2022-04-04 DIAGNOSIS — R82998 Other abnormal findings in urine: Secondary | ICD-10-CM | POA: Diagnosis not present

## 2022-04-04 DIAGNOSIS — E7800 Pure hypercholesterolemia, unspecified: Secondary | ICD-10-CM | POA: Diagnosis not present

## 2022-04-04 DIAGNOSIS — Z0000 Encounter for general adult medical examination without abnormal findings: Secondary | ICD-10-CM | POA: Diagnosis not present

## 2022-04-04 DIAGNOSIS — N486 Induration penis plastica: Secondary | ICD-10-CM | POA: Diagnosis not present

## 2022-04-12 ENCOUNTER — Other Ambulatory Visit: Payer: Self-pay

## 2022-04-12 ENCOUNTER — Encounter (HOSPITAL_COMMUNITY): Payer: Self-pay | Admitting: Urology

## 2022-04-12 NOTE — Progress Notes (Addendum)
COVID Vaccine Completed:  No  Date of COVID positive in last 90 days:  No  PCP - Domenick Gong, MD Cardiologist - N/A  Chest x-ray - N/A EKG -  Stress Test - N/A ECHO - N/A Cardiac Cath - N/A Pacemaker/ICD device last checked: Spinal Cord Stimulator:N/A  Bowel Prep - N/A  Sleep Study - N/A CPAP -   Fasting Blood Sugar - N/A Checks Blood Sugar _____ times a day  Last dose of GLP1 agonist-  N/A GLP1 instructions:  N/A   Last dose of SGLT-2 inhibitors-  N/A SGLT-2 instructions: N/A  Blood Thinner Instructions:N/A Aspirin Instructions: Last Dose:  Activity level:  Can go up a flight of stairs and perform activities of daily living without stopping and without symptoms of chest pain or shortness of breath.  Able to exercise without symptoms  Anesthesia review: N/A  Patient denies shortness of breath, fever, cough and chest pain at PAT appointment (completed over the phone)  Patient verbalized understanding of instructions that were given to them at the PAT appointment. Patient was also instructed that they will need to review over the PAT instructions again at home before surgery.

## 2022-04-13 ENCOUNTER — Other Ambulatory Visit: Payer: Self-pay

## 2022-04-13 ENCOUNTER — Encounter (HOSPITAL_COMMUNITY): Payer: Self-pay | Admitting: Urology

## 2022-04-13 ENCOUNTER — Encounter (HOSPITAL_COMMUNITY)
Admission: RE | Disposition: A | Discharge status: Home / Self Care | Payer: Self-pay | Source: Home / Self Care | Attending: Urology

## 2022-04-13 ENCOUNTER — Ambulatory Visit (HOSPITAL_BASED_OUTPATIENT_CLINIC_OR_DEPARTMENT_OTHER): Payer: PPO | Admitting: Anesthesiology

## 2022-04-13 ENCOUNTER — Ambulatory Visit (HOSPITAL_COMMUNITY): Payer: PPO | Admitting: Anesthesiology

## 2022-04-13 ENCOUNTER — Ambulatory Visit (HOSPITAL_COMMUNITY)
Admission: RE | Admit: 2022-04-13 | Discharge: 2022-04-13 | Disposition: A | Discharge status: Home / Self Care | Payer: PPO | Attending: Urology | Admitting: Urology

## 2022-04-13 DIAGNOSIS — Z8719 Personal history of other diseases of the digestive system: Secondary | ICD-10-CM | POA: Diagnosis not present

## 2022-04-13 DIAGNOSIS — Z87891 Personal history of nicotine dependence: Secondary | ICD-10-CM | POA: Diagnosis not present

## 2022-04-13 DIAGNOSIS — I10 Essential (primary) hypertension: Secondary | ICD-10-CM

## 2022-04-13 DIAGNOSIS — Z01812 Encounter for preprocedural laboratory examination: Secondary | ICD-10-CM

## 2022-04-13 DIAGNOSIS — C61 Malignant neoplasm of prostate: Secondary | ICD-10-CM | POA: Insufficient documentation

## 2022-04-13 DIAGNOSIS — E785 Hyperlipidemia, unspecified: Secondary | ICD-10-CM | POA: Diagnosis not present

## 2022-04-13 DIAGNOSIS — Z801 Family history of malignant neoplasm of trachea, bronchus and lung: Secondary | ICD-10-CM | POA: Insufficient documentation

## 2022-04-13 DIAGNOSIS — Z800 Family history of malignant neoplasm of digestive organs: Secondary | ICD-10-CM | POA: Insufficient documentation

## 2022-04-13 HISTORY — PX: CRYOABLATION: SHX1415

## 2022-04-13 LAB — CBC
HCT: 46 % (ref 39.0–52.0)
Hemoglobin: 15.5 g/dL (ref 13.0–17.0)
MCH: 31.4 pg (ref 26.0–34.0)
MCHC: 33.7 g/dL (ref 30.0–36.0)
MCV: 93.3 fL (ref 80.0–100.0)
Platelets: 219 10*3/uL (ref 150–400)
RBC: 4.93 MIL/uL (ref 4.22–5.81)
RDW: 12 % (ref 11.5–15.5)
WBC: 6.5 10*3/uL (ref 4.0–10.5)
nRBC: 0 % (ref 0.0–0.2)

## 2022-04-13 LAB — BASIC METABOLIC PANEL
Anion gap: 9 (ref 5–15)
BUN: 16 mg/dL (ref 8–23)
CO2: 26 mmol/L (ref 22–32)
Calcium: 9.5 mg/dL (ref 8.9–10.3)
Chloride: 101 mmol/L (ref 98–111)
Creatinine, Ser: 1.17 mg/dL (ref 0.61–1.24)
GFR, Estimated: >60 mL/min (ref >60)
Glucose, Bld: 101 mg/dL — ABNORMAL HIGH (ref 70–99)
Potassium: 4.1 mmol/L (ref 3.5–5.1)
Sodium: 136 mmol/L (ref 135–145)

## 2022-04-13 SURGERY — CRYOABLATION, PROSTATE
Anesthesia: General

## 2022-04-13 MED ORDER — MIDAZOLAM HCL 2 MG/2ML IJ SOLN
INTRAMUSCULAR | Status: AC
  Filled 2022-04-13: qty 2

## 2022-04-13 MED ORDER — MIDAZOLAM HCL 2 MG/2ML IJ SOLN
INTRAMUSCULAR | Status: DC | PRN
  Administered 2022-04-13: 2 mg via INTRAVENOUS

## 2022-04-13 MED ORDER — LIDOCAINE 2% (20 MG/ML) 5 ML SYRINGE
INTRAMUSCULAR | Status: DC | PRN
  Administered 2022-04-13: 60 mg via INTRAVENOUS

## 2022-04-13 MED ORDER — SENNOSIDES-DOCUSATE SODIUM 8.6-50 MG PO TABS: 1.0000 | ORAL_TABLET | Freq: Two times a day (BID) | ORAL | 0 refills | Status: AC

## 2022-04-13 MED ORDER — PROMETHAZINE HCL 25 MG/ML IJ SOLN: 6.2500 mg | INTRAMUSCULAR | Status: DC | PRN

## 2022-04-13 MED ORDER — MEPERIDINE HCL 50 MG/ML IJ SOLN: 6.2500 mg | INTRAMUSCULAR | Status: DC | PRN

## 2022-04-13 MED ORDER — SODIUM CHLORIDE 0.9 % IR SOLN
Status: DC | PRN
  Administered 2022-04-13: 1000 mL

## 2022-04-13 MED ORDER — GENTAMICIN SULFATE 40 MG/ML IJ SOLN
5.0000 mg/kg | INTRAVENOUS | Status: AC
  Administered 2022-04-13: 361.6 mg via INTRAVENOUS
  Filled 2022-04-13: qty 9

## 2022-04-13 MED ORDER — FENTANYL CITRATE (PF) 100 MCG/2ML IJ SOLN
INTRAMUSCULAR | Status: AC
  Filled 2022-04-13: qty 2

## 2022-04-13 MED ORDER — OXYCODONE-ACETAMINOPHEN 5-325 MG PO TABS: 1.0000 | ORAL_TABLET | Freq: Four times a day (QID) | ORAL | 0 refills | Status: AC | PRN

## 2022-04-13 MED ORDER — HYDROMORPHONE HCL 1 MG/ML IJ SOLN: 0.2500 mg | INTRAMUSCULAR | Status: DC | PRN

## 2022-04-13 MED ORDER — PROPOFOL 10 MG/ML IV BOLUS
INTRAVENOUS | Status: AC
  Filled 2022-04-13: qty 20

## 2022-04-13 MED ORDER — FENTANYL CITRATE (PF) 250 MCG/5ML IJ SOLN
INTRAMUSCULAR | Status: DC | PRN
  Administered 2022-04-13: 25 ug via INTRAVENOUS
  Administered 2022-04-13: 50 ug via INTRAVENOUS
  Administered 2022-04-13: 25 ug via INTRAVENOUS

## 2022-04-13 MED ORDER — PROPOFOL 10 MG/ML IV BOLUS
INTRAVENOUS | Status: DC | PRN
  Administered 2022-04-13: 200 mg via INTRAVENOUS

## 2022-04-13 MED ORDER — SULFAMETHOXAZOLE-TRIMETHOPRIM 800-160 MG PO TABS: 1.0000 | ORAL_TABLET | Freq: Every day | ORAL | 0 refills | Status: AC

## 2022-04-13 MED ORDER — OXYCODONE HCL 5 MG/5ML PO SOLN: 5.0000 mg | Freq: Once | ORAL | Status: DC | PRN

## 2022-04-13 MED ORDER — OXYCODONE HCL 5 MG PO TABS: 5.0000 mg | ORAL_TABLET | Freq: Once | ORAL | Status: DC | PRN

## 2022-04-13 MED ORDER — DEXAMETHASONE SODIUM PHOSPHATE 10 MG/ML IJ SOLN
INTRAMUSCULAR | Status: DC | PRN
  Administered 2022-04-13: 10 mg via INTRAVENOUS

## 2022-04-13 MED ORDER — LACTATED RINGERS IV SOLN: INTRAVENOUS | Status: DC

## 2022-04-13 MED ORDER — AMISULPRIDE (ANTIEMETIC) 5 MG/2ML IV SOLN: 10.0000 mg | Freq: Once | INTRAVENOUS | Status: DC | PRN

## 2022-04-13 MED ORDER — DEXAMETHASONE SODIUM PHOSPHATE 10 MG/ML IJ SOLN
INTRAMUSCULAR | Status: AC
  Filled 2022-04-13: qty 1

## 2022-04-13 MED ORDER — EPHEDRINE SULFATE (PRESSORS) 50 MG/ML IJ SOLN
INTRAMUSCULAR | Status: DC | PRN
  Administered 2022-04-13: 10 mg via INTRAVENOUS

## 2022-04-13 MED ORDER — ONDANSETRON HCL 4 MG/2ML IJ SOLN
INTRAMUSCULAR | Status: DC | PRN
  Administered 2022-04-13: 4 mg via INTRAVENOUS

## 2022-04-13 MED ORDER — STERILE WATER FOR IRRIGATION IR SOLN
Status: DC | PRN
  Administered 2022-04-13: 3000 mL

## 2022-04-13 MED ORDER — ONDANSETRON HCL 4 MG/2ML IJ SOLN
INTRAMUSCULAR | Status: AC
  Filled 2022-04-13: qty 2

## 2022-04-13 MED ORDER — KETOROLAC TROMETHAMINE 30 MG/ML IJ SOLN
INTRAMUSCULAR | Status: AC
  Filled 2022-04-13: qty 1

## 2022-04-13 MED ORDER — EPHEDRINE 5 MG/ML INJ
INTRAVENOUS | Status: AC
  Filled 2022-04-13: qty 5

## 2022-04-13 SURGICAL SUPPLY — 24 items
BAG DRN RND TRDRP ANRFLXCHMBR (UROLOGICAL SUPPLIES) ×1
BAG URINE DRAIN 2000ML AR STRL (UROLOGICAL SUPPLIES) ×2 IMPLANT
BNDG GAUZE DERMACEA FLUFF 4 (GAUZE/BANDAGES/DRESSINGS) ×2 IMPLANT
BNDG GZE DERMACEA 4 6PLY (GAUZE/BANDAGES/DRESSINGS) ×1
CATH FOLEY 2WAY SLVR  5CC 18FR (CATHETERS) ×1
CATH FOLEY 2WAY SLVR 5CC 18FR (CATHETERS) ×4 IMPLANT
CATH TIEMANN FOLEY 18FR 5CC (CATHETERS) IMPLANT
CRYO ENDOCARE PROSTATE CRYO101 (LABOR (TRAVEL & OVERTIME)) ×1 IMPLANT
DRAPE HALF SHEET 70X43 (DRAPES) IMPLANT
DRSG TEGADERM 8X12 (GAUZE/BANDAGES/DRESSINGS) IMPLANT
DRSG TELFA 3X8 NADH STRL (GAUZE/BANDAGES/DRESSINGS) ×2 IMPLANT
GAS ARGON HIGH PRESSURE (MEDICAL GASES) ×2 IMPLANT
GAS HELIUM HIGH PRESSURE (MEDICAL GASES) ×2 IMPLANT
GAUZE SPONGE 4X4 12PLY STRL (GAUZE/BANDAGES/DRESSINGS) IMPLANT
GLOVE SURG LX STRL 7.5 STRW (GLOVE) ×2 IMPLANT
GOWN STRL REUS W/ TWL XL LVL3 (GOWN DISPOSABLE) ×2 IMPLANT
GOWN STRL REUS W/TWL XL LVL3 (GOWN DISPOSABLE) ×1
GUIDEWIRE AMPLATZ STIFF 0.35 (WIRE) ×2 IMPLANT
KIT EC CRYO PROSTATE CRYO207V (DISPOSABLE) IMPLANT
PACK CYSTO (CUSTOM PROCEDURE TRAY) ×2 IMPLANT
PLUG CATH AND CAP STER (CATHETERS) ×2 IMPLANT
PROCEDURE CRYO ENDOCARE PROSTE (LABOR (TRAVEL & OVERTIME)) IMPLANT
TAPE CLOTH SURG 4X10 WHT LF (GAUZE/BANDAGES/DRESSINGS) IMPLANT
TOWEL OR 17X26 10 PK STRL BLUE (TOWEL DISPOSABLE) ×2 IMPLANT

## 2022-04-13 NOTE — Anesthesia Procedure Notes (Signed)
Procedure Name: LMA Insertion Date/Time: 04/13/2022 1:16 PM  Performed by: Randye Lobo, CRNAPre-anesthesia Checklist: Patient identified, Emergency Drugs available, Suction available and Patient being monitored Patient Re-evaluated:Patient Re-evaluated prior to induction Oxygen Delivery Method: Circle System Utilized Preoxygenation: Pre-oxygenation with 100% oxygen Induction Type: IV induction Ventilation: Mask ventilation without difficulty LMA: LMA inserted LMA Size: 4.0 Number of attempts: 1 Airway Equipment and Method: Bite block Placement Confirmation: positive ETCO2 Tube secured with: Tape Dental Injury: Teeth and Oropharynx as per pre-operative assessment

## 2022-04-13 NOTE — Discharge Instructions (Signed)
1 - You may have urinary urgency (bladder spasms), pass small debris,  and bloody urine on / off for up to 2 weeks.  This is normal.  2 - Call MD or go to ER for fever >102, severe pain / nausea / vomiting not relieved by medications, or acute change in medical status  

## 2022-04-13 NOTE — Anesthesia Postprocedure Evaluation (Signed)
Anesthesia Post Note  Patient: ARRIAN KNOCH  Procedure(s) Performed: CRYO ABLATION PROSTATE ( RIGHT HEMI-CRYO)     Patient location during evaluation: PACU Anesthesia Type: General Level of consciousness: awake and alert Pain management: pain level controlled Vital Signs Assessment: post-procedure vital signs reviewed and stable Respiratory status: spontaneous breathing, nonlabored ventilation and respiratory function stable Cardiovascular status: blood pressure returned to baseline and stable Postop Assessment: no apparent nausea or vomiting Anesthetic complications: no   No notable events documented.  Last Vitals:  Vitals:   04/13/22 1545 04/13/22 1602  BP: (!) 164/90 (!) 170/79  Pulse: 60 76  Resp: 14 16  Temp:  36.5 C  SpO2: 99% 96%    Last Pain:  Vitals:   04/13/22 1602  TempSrc: Oral  PainSc: 0-No pain                 Lynda Rainwater

## 2022-04-13 NOTE — Op Note (Signed)
NAME: Willie Jackson, Willie Jackson MEDICAL RECORD NO: [id number] ACCOUNT NO: [id number] DATE OF BIRTH: 1950-09-17 FACILITY: [facility] LOCATION: [facility] PHYSICIAN: Alexis Frock, MD  Operative Report   DATE OF PROCEDURE: 04/13/2022  PREOPERATIVE DIAGNOSIS:  Recurrent prostate cancer.  PROCEDURE:  Cryoablation of the prostate, cystoscopy.  ESTIMATED BLOOD LOSS:  Nil.  COMPLICATIONS:  None.  SPECIMEN:  None.  FINDINGS:   1.  18 gram prostate by transrectal ultrasound. 2.  Successful right hemiablation of prostate with 2 freeze and thaw cycles. 3.  Unremarkable cystoscopy after probe placement.  DRAINS:  Foley catheter to straight drain.  INDICATIONS:  The patient is a very pleasant 72 year old man with history of adenocarcinoma of the prostate.  He is status post primary brachytherapy in 2019 and initially had very good biochemical response.  However, his nadir was nonoptimal, over time  he developed biochemical recurrence with a rising PSA to have several staging imaging with PET CT revealed residual versus recurrent disease in the prostate itself only on the right side.  There was no distant disease.  Options were discussed including  palliative versus curative modalities.  He wished to proceed with salvage cryoablation with curative intent.  Given the right sided predominance of disease we discussed hemi-cryoablation is being optimal in terms of risks, benefit profile.  He presents  for this today.  Informed consent was obtained and placed in medical record.  PROCEDURE IN DETAIL:  The patient is being identified and verified, procedure being cryoablation of the prostate with cystoscopy was confirmed.  Procedure timeout was performed.  Intravenous antibiotics were administered.  General LMA anesthesia was  induced.  The patient was placed into medium lithotomy position.  Sterile field was created by first clipper shaving then prepping patient's penis, perineum, and proximal thighs using iodine.   Foley catheter was placed per urethra to straight drain.   Transrectal ultrasound was then performed.  Biplanar ultrasound revealed a 18 gram prostate with numerous brachytherapy seeds as expected unremarkable seminal vesicles.  Next three cryoprobes were placed in a C-shape fashion respectively on the right side of the prostate under transrectal  ultrasound guidance the chosen treatment distance of approximately 2.7 cm.  Temperature probes were placed at the external sphincter into the Denonvilliers fascia a transperineal placement. In situ Foley catheter was removed and flexible cystoscopy was  performed.  Cystoscopy revealed unremarkable anterior and posterior urethra.  There was no evidence of any urethral trauma while placement of the temperature probes or cryoprobes.  Inspection of urinary bladder revealed mild trabeculation.  Ureteral orifices were  single bilaterally.  Retroflexion showed no evidence of the cryoprobes within the bladder.  Next, the urethral warmer apparatus was carefully placed over the Super Stiff wire verified functional and the first time freeze cycle was performed in anterior,  posterior direction, visualizing the ice ball formation.  As the ice ball completely encompassed the right side of the prostate and inferiorly towards the midline, but not crossing the midline, the Denonvilliers and external sphincter temperature  remained acceptable.  Those were thawed completely with resolution of ice ball on imaging and a second freeze cycle was performed as per the first followed by second following until all visualized ice ball was resolved. The cryoprobes and temperature  probes were removed.  Pressure was held over the perineum approximately 3 minutes shows an excellent hemostasis.  A new Coude type catheter was placed per urethra to straight drain.  Procedure was terminated.  The patient tolerated procedure well, no  immediate perioperative complications.  The patient was taken  to postanesthesia care in stable condition.  Plan for discharge home.  He will followup next week for trial of void.   PUS D: 04/13/2022 2:55:13 pm T: 04/13/2022 5:03:00 pm  JOB: [id number]

## 2022-04-13 NOTE — Transfer of Care (Signed)
Immediate Anesthesia Transfer of Care Note  Patient: Willie Jackson  Procedure(s) Performed: CRYO ABLATION PROSTATE ( RIGHT HEMI-CRYO)  Patient Location: PACU  Anesthesia Type:General  Level of Consciousness: awake, alert , oriented, patient cooperative, and responds to stimulation  Airway & Oxygen Therapy: Patient Spontanous Breathing and Patient connected to nasal cannula oxygen  Post-op Assessment: Report given to RN, Post -op Vital signs reviewed and stable, and Patient moving all extremities X 4  Post vital signs: Reviewed and stable  Last Vitals:  Vitals Value Taken Time  BP 159/99 04/13/22 1509  Temp    Pulse 72 04/13/22 1511  Resp 16 04/13/22 1511  SpO2 100 % 04/13/22 1511  Vitals shown include unvalidated device data.  Last Pain:  Vitals:   04/13/22 1132  TempSrc:   PainSc: 0-No pain         Complications: No notable events documented.

## 2022-04-13 NOTE — H&P (Signed)
Willie Jackson is an 72 y.o. male.    Chief Complaint: pre-Op Prostate Salvage Cryoablation  HPI:   1 - Moderate Risk Prostate Cancer- s/p primary brachytherapy with SPACE-OAR 08/2017 for two cores Gleason 3+4=7 on eval PSA 17.1. TRUS 52m, no median lobe.   Post-Brachy Course:  10/19 - PSA 3.32  05/2018 - PSA 2.1 (recent PCP labs) / DRE early scarrification; 11/2018 PSA 0.68 / DRE 20gm scarrified.  05/2019 - PSA 0.8 / DRE 20gm stable; 12/2019 PSA 0.8 / DRE 20gm stable  06/2020 - PSA 0.99, DRE scarrified  07/2021 - PSA 2.08; 01/2022 - PSA 3.2  03/2022 ==> PSMA PET - Rt sided prostate activity (Rt base, no Left sided or apical uptake), no distant disease.    PMH sig for HLD, HTN, mild obesity, umbilical and bilateral inguinal hernia repairs, high ETOH intake. He works for himself on [employer], [employer]). His wife Willie Newcomeris very involved. His PCP is RDomenick GongMD.   Today "[name] is seen to proceed with prostate cryoablation for recurrent prostate cancer. No interval fevers.   Past Medical History:  Diagnosis Date   ED (erectile dysfunction)    Hypercholesterolemia    Hypertension    Nocturia    Prostate cancer ([facility] urologist-  dr mTresa Moore oncologist-  dr mTammi Klippel  dx 04-08-2017-- Stage T1c, Gleason 3+4, PSA 17.1-- scheduled for radioactive seed implants    Past Surgical History:  Procedure Laterality Date   INGUINAL HERNIA REPAIR Right 1973   PROSTATE BIOPSY  04-08-2017  dr mTresa Mooreoffice   RADIOACTIVE SEED IMPLANT N/A 08/14/2017   Procedure: RADIOACTIVE SEED IMPLANT/BRACHYTHERAPY IMPLANT;  Surgeon: MAlexis Frock MD;  [facility]  Service: Urology;  Laterality: N/A;   SPACE OAR INSTILLATION N/A 08/14/2017   Procedure: SPACE OAR INSTILLATION;  Surgeon: MAlexis Frock MD;  [facility]  Service: Urology;  Laterality: N/A;   UMBILICAL HERNIA REPAIR  2008   and LEFT INGUINAL HERNIA  REPAIR    Family History  Problem Relation Age of Onset   Lung cancer Mother    Throat cancer Father    Social History:  reports that he quit smoking about 28 years ago. His smoking use included cigarettes. He has a 41.00 pack-year smoking history. He has never used smokeless tobacco. He reports current alcohol use of about 42.0 standard drinks of alcohol per week. He reports that he does not use drugs.  Allergies: No Known Allergies  No medications prior to admission.    No results found for this or any previous visit (from the past 48 hour(s)). No results found.  Review of Systems  Constitutional:  Negative for chills and fever.  All other systems reviewed and are negative.   Height '5\' 7"'$  (1.702 m), weight 81.6 kg. Physical Exam Vitals reviewed.  Constitutional:      Comments: Pleasant, facial rubor that is stable, at baseline.   Eyes:     Pupils: Pupils are equal, round, and reactive to light.  Cardiovascular:     Rate and Rhythm: Normal rate.  Pulmonary:     Effort: Pulmonary effort is normal.  Abdominal:     General: Abdomen is flat.  Genitourinary:    Comments: No CVAT at present Musculoskeletal:        General: Normal range of motion.     Cervical back: Normal range of motion.  Skin:    General: Skin is warm.  Neurological:  General: No focal deficit present.     Mental Status: He is alert.  Psychiatric:        Mood and Affect: Mood normal.      Assessment/Plan  Proceed as planned with prostate cryoablation (salvage, Rt hemi). Risks, benefits, alternatives, expected peri-op course discussed previously and reiterated today.   Alexis Frock, MD 04/13/2022, 5:34 AM

## 2022-04-13 NOTE — Anesthesia Preprocedure Evaluation (Signed)
Anesthesia Evaluation  Patient identified by MRN, date of birth, ID band Patient awake    Reviewed: Allergy & Precautions, NPO status , Patient's Chart, lab work & pertinent test results  Airway Mallampati: II  TM Distance: >3 FB Neck ROM: Full    Dental no notable dental hx.    Pulmonary neg pulmonary ROS, former smoker   Pulmonary exam normal breath sounds clear to auscultation       Cardiovascular hypertension, Pt. on medications Normal cardiovascular exam Rhythm:Regular Rate:Normal     Neuro/Psych negative neurological ROS  negative psych ROS   GI/Hepatic negative GI ROS, Neg liver ROS,,,  Endo/Other  negative endocrine ROS    Renal/GU negative Renal ROS  negative genitourinary   Musculoskeletal negative musculoskeletal ROS (+)    Abdominal   Peds negative pediatric ROS (+)  Hematology negative hematology ROS (+)   Anesthesia Other Findings Prostate cancer  Reproductive/Obstetrics negative OB ROS                             Anesthesia Physical Anesthesia Plan  ASA: 3  Anesthesia Plan: General   Post-op Pain Management: Dilaudid IV   Induction: Intravenous  PONV Risk Score and Plan: 2 and Ondansetron, Treatment may vary due to age or medical condition and Midazolam  Airway Management Planned: LMA  Additional Equipment:   Intra-op Plan:   Post-operative Plan: Extubation in OR  Informed Consent: I have reviewed the patients History and Physical, chart, labs and discussed the procedure including the risks, benefits and alternatives for the proposed anesthesia with the patient or authorized representative who has indicated his/her understanding and acceptance.     Dental advisory given  Plan Discussed with: CRNA and Surgeon  Anesthesia Plan Comments:         Anesthesia Quick Evaluation

## 2022-04-13 NOTE — Brief Op Note (Signed)
04/13/2022  2:49 PM  PATIENT:  Willie Jackson  72 y.o. male  PRE-OPERATIVE DIAGNOSIS:  RECURRENT PROSTATE CANCER  POST-OPERATIVE DIAGNOSIS:  RECURRENT PROSTATE CANCER  PROCEDURE:  Procedure(s): CRYO ABLATION PROSTATE ( RIGHT HEMI-CRYO) (N/A)  SURGEON:  Surgeon(s) and Role:    * Alexis Frock, MD - Primary  PHYSICIAN ASSISTANT:   ASSISTANTS: none   ANESTHESIA:   general  EBL:  minimal   BLOOD ADMINISTERED:none  DRAINS:  foley to gravity    LOCAL MEDICATIONS USED:  NONE  SPECIMEN:  No Specimen  DISPOSITION OF SPECIMEN:  N/A  COUNTS:  YES  TOURNIQUET:  * No tourniquets in log *  DICTATION: .Other Dictation: Dictation Number [id number]  PLAN OF CARE: Discharge to home after PACU  PATIENT DISPOSITION:  PACU - hemodynamically stable.   Delay start of Pharmacological VTE agent (>24hrs) due to surgical blood loss or risk of bleeding: not applicable

## 2022-04-14 ENCOUNTER — Encounter (HOSPITAL_COMMUNITY): Payer: Self-pay | Admitting: Urology

## 2022-05-02 ENCOUNTER — Encounter (HOSPITAL_COMMUNITY): Payer: Self-pay | Admitting: Urology

## 2022-07-12 DIAGNOSIS — C61 Malignant neoplasm of prostate: Secondary | ICD-10-CM | POA: Diagnosis not present

## 2022-08-07 DIAGNOSIS — N486 Induration penis plastica: Secondary | ICD-10-CM | POA: Diagnosis not present

## 2022-08-07 DIAGNOSIS — N5201 Erectile dysfunction due to arterial insufficiency: Secondary | ICD-10-CM | POA: Diagnosis not present

## 2022-08-07 DIAGNOSIS — C61 Malignant neoplasm of prostate: Secondary | ICD-10-CM | POA: Diagnosis not present

## 2022-10-05 DIAGNOSIS — C61 Malignant neoplasm of prostate: Secondary | ICD-10-CM | POA: Diagnosis not present

## 2022-10-05 DIAGNOSIS — N486 Induration penis plastica: Secondary | ICD-10-CM | POA: Diagnosis not present

## 2022-10-05 DIAGNOSIS — I10 Essential (primary) hypertension: Secondary | ICD-10-CM | POA: Diagnosis not present

## 2022-10-05 DIAGNOSIS — E7800 Pure hypercholesterolemia, unspecified: Secondary | ICD-10-CM | POA: Diagnosis not present

## 2022-10-05 DIAGNOSIS — F5221 Male erectile disorder: Secondary | ICD-10-CM | POA: Diagnosis not present

## 2022-10-05 DIAGNOSIS — E663 Overweight: Secondary | ICD-10-CM | POA: Diagnosis not present

## 2022-10-05 DIAGNOSIS — F1010 Alcohol abuse, uncomplicated: Secondary | ICD-10-CM | POA: Diagnosis not present

## 2023-02-11 DIAGNOSIS — C61 Malignant neoplasm of prostate: Secondary | ICD-10-CM | POA: Diagnosis not present

## 2023-02-18 DIAGNOSIS — N486 Induration penis plastica: Secondary | ICD-10-CM | POA: Diagnosis not present

## 2023-02-18 DIAGNOSIS — C61 Malignant neoplasm of prostate: Secondary | ICD-10-CM | POA: Diagnosis not present

## 2023-02-18 DIAGNOSIS — N5201 Erectile dysfunction due to arterial insufficiency: Secondary | ICD-10-CM | POA: Diagnosis not present

## 2023-04-03 DIAGNOSIS — E7800 Pure hypercholesterolemia, unspecified: Secondary | ICD-10-CM | POA: Diagnosis not present

## 2023-04-03 DIAGNOSIS — C61 Malignant neoplasm of prostate: Secondary | ICD-10-CM | POA: Diagnosis not present

## 2023-04-03 DIAGNOSIS — I10 Essential (primary) hypertension: Secondary | ICD-10-CM | POA: Diagnosis not present

## 2023-04-03 DIAGNOSIS — Z1212 Encounter for screening for malignant neoplasm of rectum: Secondary | ICD-10-CM | POA: Diagnosis not present

## 2023-04-03 DIAGNOSIS — E785 Hyperlipidemia, unspecified: Secondary | ICD-10-CM | POA: Diagnosis not present

## 2023-04-03 DIAGNOSIS — Z1389 Encounter for screening for other disorder: Secondary | ICD-10-CM | POA: Diagnosis not present

## 2023-04-10 DIAGNOSIS — E663 Overweight: Secondary | ICD-10-CM | POA: Diagnosis not present

## 2023-04-10 DIAGNOSIS — N486 Induration penis plastica: Secondary | ICD-10-CM | POA: Diagnosis not present

## 2023-04-10 DIAGNOSIS — Z0000 Encounter for general adult medical examination without abnormal findings: Secondary | ICD-10-CM | POA: Diagnosis not present

## 2023-04-10 DIAGNOSIS — Z1339 Encounter for screening examination for other mental health and behavioral disorders: Secondary | ICD-10-CM | POA: Diagnosis not present

## 2023-04-10 DIAGNOSIS — F5221 Male erectile disorder: Secondary | ICD-10-CM | POA: Diagnosis not present

## 2023-04-10 DIAGNOSIS — E7800 Pure hypercholesterolemia, unspecified: Secondary | ICD-10-CM | POA: Diagnosis not present

## 2023-04-10 DIAGNOSIS — Z1212 Encounter for screening for malignant neoplasm of rectum: Secondary | ICD-10-CM | POA: Diagnosis not present

## 2023-04-10 DIAGNOSIS — I10 Essential (primary) hypertension: Secondary | ICD-10-CM | POA: Diagnosis not present

## 2023-04-10 DIAGNOSIS — Z1331 Encounter for screening for depression: Secondary | ICD-10-CM | POA: Diagnosis not present

## 2023-04-10 DIAGNOSIS — E785 Hyperlipidemia, unspecified: Secondary | ICD-10-CM | POA: Diagnosis not present

## 2023-04-10 DIAGNOSIS — Z23 Encounter for immunization: Secondary | ICD-10-CM | POA: Diagnosis not present

## 2023-04-10 DIAGNOSIS — R82998 Other abnormal findings in urine: Secondary | ICD-10-CM | POA: Diagnosis not present

## 2023-04-10 DIAGNOSIS — C61 Malignant neoplasm of prostate: Secondary | ICD-10-CM | POA: Diagnosis not present

## 2023-04-10 DIAGNOSIS — F1010 Alcohol abuse, uncomplicated: Secondary | ICD-10-CM | POA: Diagnosis not present

## 2023-08-27 DIAGNOSIS — N486 Induration penis plastica: Secondary | ICD-10-CM | POA: Diagnosis not present

## 2023-08-27 DIAGNOSIS — C61 Malignant neoplasm of prostate: Secondary | ICD-10-CM | POA: Diagnosis not present

## 2023-08-27 DIAGNOSIS — N5201 Erectile dysfunction due to arterial insufficiency: Secondary | ICD-10-CM | POA: Diagnosis not present

## 2023-10-17 DIAGNOSIS — N486 Induration penis plastica: Secondary | ICD-10-CM | POA: Diagnosis not present

## 2023-10-17 DIAGNOSIS — I10 Essential (primary) hypertension: Secondary | ICD-10-CM | POA: Diagnosis not present

## 2023-10-17 DIAGNOSIS — F1010 Alcohol abuse, uncomplicated: Secondary | ICD-10-CM | POA: Diagnosis not present

## 2023-10-17 DIAGNOSIS — E663 Overweight: Secondary | ICD-10-CM | POA: Diagnosis not present

## 2023-10-17 DIAGNOSIS — E7800 Pure hypercholesterolemia, unspecified: Secondary | ICD-10-CM | POA: Diagnosis not present

## 2023-10-17 DIAGNOSIS — C61 Malignant neoplasm of prostate: Secondary | ICD-10-CM | POA: Diagnosis not present

## 2023-10-17 DIAGNOSIS — F5221 Male erectile disorder: Secondary | ICD-10-CM | POA: Diagnosis not present

## 2024-02-05 ENCOUNTER — Ambulatory Visit: Admitting: Podiatry

## 2024-02-05 DIAGNOSIS — Q666 Other congenital valgus deformities of feet: Secondary | ICD-10-CM | POA: Diagnosis not present

## 2024-02-05 DIAGNOSIS — M7661 Achilles tendinitis, right leg: Secondary | ICD-10-CM

## 2024-02-05 NOTE — Progress Notes (Signed)
 "  Subjective:  Patient ID: Willie Jackson, male    DOB: 1950-08-14,  MRN: [id number]  Chief Complaint  Patient presents with   Foot Pain    Pt was diagnosed with Achilles tendonitis     73 y.o. male presents with the above complaint.  Patient presents with right Achilles tendinitis.  Painful to touch is progressing and worse worse with ambulation and shoe pressure he would like to discuss treatment options for it has not seen and 1 is part to see me.  This came out of nowhere he would like to discuss treatment options for this.  Does not wear any orthotics and would like to obtain those as well.   Review of Systems: Negative except as noted in the HPI. Denies N/V/F/Ch.  Past Medical History:  Diagnosis Date   ED (erectile dysfunction)    Hypercholesterolemia    Hypertension    Nocturia    Prostate cancer [facility]) urologist-  dr annitta  oncologist-  dr patrcia   dx 04-08-2017-- Stage T1c, Gleason 3+4, PSA 17.1-- scheduled for radioactive seed implants   Current Medications[1]  Tobacco Use History[2]  Allergies[3] Objective:  There were no vitals filed for this visit. There is no height or weight on file to calculate BMI. Constitutional Well developed. Well nourished.  Vascular Dorsalis pedis pulses palpable bilaterally. Posterior tibial pulses palpable bilaterally. Capillary refill normal to all digits.  No cyanosis or clubbing noted. Pedal hair growth normal.  Neurologic Normal speech. Oriented to person, place, and time. Epicritic sensation to light touch grossly present bilaterally.  Dermatologic Nails well groomed and normal in appearance. No open wounds. No skin lesions.  Orthopedic: Right Achilles tendinitis pain on palpation to the tendon pain with dorsiflexion of the foot no pain with plantarflexion of the foot.  Positive Haglund's deformity noted positive Silfverskiold test noted gastrocnemius equinus.  No pain at the ATFL ligament peroneal tendon posterior tibial  tendon   Radiographs: None Assessment:   1. Right Achilles tendinitis    Plan:  Patient was evaluated and treated and all questions answered.  Right Achilles tendinitis - All questions or concerns were discussed with the patient extensively today given the amount of pain that he is experiencing would benefit from cam boot immobilization Cam boot was dispensed also discussed shoe gear modification and orthotics as well.  He states understanding like to proceed with orthotics   Pes planovalgus/foot deformity -I explained to patient the etiology of pes planovalgus and relationship with heel pain/arch pain and various treatment options were discussed.  Given patient foot structure in the setting of heel pain/arch pain I believe patient will benefit from custom-made orthotics to help control the hindfoot motion support the arch of the foot and take the stress away from arches.  Patient agrees with the plan like to proceed with orthotics -Patient was casted for orthotics with 1/4 inch heel lift   No follow-ups on file.     [1]  Current Outpatient Medications:    amLODipine (NORVASC) 5 MG tablet, Take 5 mg by mouth daily., Disp: , Rfl:    atorvastatin (LIPITOR) 20 MG tablet, Take 20 mg by mouth daily., Disp: , Rfl:    senna-docusate (SENOKOT-S) 8.6-50 MG tablet, Take 1 tablet by mouth 2 (two) times daily. While taking strong pain meds to prevent constipation, Disp: 10 tablet, Rfl: 0   sulfamethoxazole -trimethoprim  (BACTRIM  DS) 800-160 MG tablet, Take 1 tablet by mouth daily. X 4 days to prevent infection with catheter in place., Disp:  4 tablet, Rfl: 0 [2]  Social History Tobacco Use  Smoking Status Former   Current packs/day: 0.00   Average packs/day: 1 pack/day for 41.0 years (41.0 ttl pk-yrs)   Types: Cigarettes   Start date: 02/12/1953   Quit date: 02/12/1994   Years since quitting: 30.0  Smokeless Tobacco Never  [3] No Known Allergies  "

## 2024-02-19 NOTE — Progress Notes (Signed)
 VU LIEBMAN                                          MRN: [id number]   02/19/2024   The [facility] Quality Team Specialist reviewed this patient medical record for the purposes of chart review for care gap closure. The following were reviewed: chart review for care gap closure-controlling blood pressure.    [facility] Quality Team

## 2024-03-02 ENCOUNTER — Telehealth: Payer: Self-pay

## 2024-03-02 NOTE — Telephone Encounter (Signed)
 Orthotics are in [place] office. Left a message for Aeon to call the office and schedule an appointment to [facility].
